# Patient Record
Sex: Female | Born: 1940
Health system: Southern US, Community
[De-identification: ages and names within clinical notes are randomized; demographics above are authoritative.]

---

## 1997-11-10 ENCOUNTER — Ambulatory Visit (HOSPITAL_COMMUNITY): Admission: RE | Admit: 1997-11-10 | Discharge: 1997-11-10 | Payer: Self-pay | Admitting: Family Medicine

## 1998-10-05 ENCOUNTER — Other Ambulatory Visit: Admission: RE | Admit: 1998-10-05 | Discharge: 1998-10-05 | Payer: Self-pay | Admitting: Family Medicine

## 1999-05-28 ENCOUNTER — Encounter: Payer: Self-pay | Admitting: Family Medicine

## 1999-05-28 ENCOUNTER — Encounter: Admission: RE | Admit: 1999-05-28 | Discharge: 1999-05-28 | Payer: Self-pay | Admitting: Family Medicine

## 1999-10-05 ENCOUNTER — Other Ambulatory Visit: Admission: RE | Admit: 1999-10-05 | Discharge: 1999-10-05 | Payer: Self-pay | Admitting: Family Medicine

## 1999-10-06 ENCOUNTER — Encounter: Payer: Self-pay | Admitting: Family Medicine

## 1999-10-06 ENCOUNTER — Encounter: Admission: RE | Admit: 1999-10-06 | Discharge: 1999-10-06 | Payer: Self-pay | Admitting: Family Medicine

## 2000-05-15 ENCOUNTER — Encounter: Admission: RE | Admit: 2000-05-15 | Discharge: 2000-05-15 | Payer: Self-pay | Admitting: Family Medicine

## 2000-05-15 ENCOUNTER — Encounter: Payer: Self-pay | Admitting: Family Medicine

## 2000-10-17 ENCOUNTER — Other Ambulatory Visit: Admission: RE | Admit: 2000-10-17 | Discharge: 2000-10-17 | Payer: Self-pay | Admitting: Family Medicine

## 2001-05-17 ENCOUNTER — Encounter: Admission: RE | Admit: 2001-05-17 | Discharge: 2001-05-17 | Payer: Self-pay | Admitting: Family Medicine

## 2001-05-17 ENCOUNTER — Encounter: Payer: Self-pay | Admitting: Family Medicine

## 2001-10-24 ENCOUNTER — Other Ambulatory Visit: Admission: RE | Admit: 2001-10-24 | Discharge: 2001-10-24 | Payer: Self-pay | Admitting: Family Medicine

## 2002-05-08 ENCOUNTER — Encounter: Payer: Self-pay | Admitting: Family Medicine

## 2002-05-08 ENCOUNTER — Encounter: Admission: RE | Admit: 2002-05-08 | Discharge: 2002-05-08 | Payer: Self-pay | Admitting: Family Medicine

## 2002-10-18 ENCOUNTER — Other Ambulatory Visit: Admission: RE | Admit: 2002-10-18 | Discharge: 2002-10-18 | Payer: Self-pay | Admitting: Family Medicine

## 2003-05-12 ENCOUNTER — Encounter: Admission: RE | Admit: 2003-05-12 | Discharge: 2003-05-12 | Payer: Self-pay | Admitting: Family Medicine

## 2003-11-18 ENCOUNTER — Other Ambulatory Visit: Admission: RE | Admit: 2003-11-18 | Discharge: 2003-11-18 | Payer: Self-pay | Admitting: Family Medicine

## 2003-11-21 ENCOUNTER — Encounter: Admission: RE | Admit: 2003-11-21 | Discharge: 2003-11-21 | Payer: Self-pay | Admitting: Family Medicine

## 2004-05-04 ENCOUNTER — Encounter: Admission: RE | Admit: 2004-05-04 | Discharge: 2004-05-04 | Payer: Self-pay | Admitting: Family Medicine

## 2004-11-19 ENCOUNTER — Ambulatory Visit: Payer: Self-pay | Admitting: Family Medicine

## 2004-11-26 ENCOUNTER — Ambulatory Visit: Payer: Self-pay | Admitting: Family Medicine

## 2004-11-26 ENCOUNTER — Other Ambulatory Visit: Admission: RE | Admit: 2004-11-26 | Discharge: 2004-11-26 | Payer: Self-pay | Admitting: Family Medicine

## 2004-12-09 ENCOUNTER — Ambulatory Visit: Payer: Self-pay | Admitting: Family Medicine

## 2005-05-15 ENCOUNTER — Emergency Department (HOSPITAL_COMMUNITY): Admission: EM | Admit: 2005-05-15 | Discharge: 2005-05-15 | Payer: Self-pay | Admitting: Orthopedic Surgery

## 2005-06-01 ENCOUNTER — Encounter: Admission: RE | Admit: 2005-06-01 | Discharge: 2005-06-01 | Payer: Self-pay | Admitting: Family Medicine

## 2005-11-25 ENCOUNTER — Ambulatory Visit: Payer: Self-pay | Admitting: Family Medicine

## 2005-12-07 ENCOUNTER — Other Ambulatory Visit: Admission: RE | Admit: 2005-12-07 | Discharge: 2005-12-07 | Payer: Self-pay | Admitting: Family Medicine

## 2005-12-07 ENCOUNTER — Ambulatory Visit: Payer: Self-pay | Admitting: Family Medicine

## 2005-12-07 ENCOUNTER — Encounter (INDEPENDENT_AMBULATORY_CARE_PROVIDER_SITE_OTHER): Payer: Self-pay | Admitting: *Deleted

## 2006-06-02 ENCOUNTER — Encounter: Admission: RE | Admit: 2006-06-02 | Discharge: 2006-06-02 | Payer: Self-pay | Admitting: Family Medicine

## 2006-06-26 IMAGING — CR DG CHEST 2V
2 series · 2 of 2 positions shown · non-contrast
Comparison: None.

CLINICAL DATA: 64-year-old, with chest pain.  Evaluate for myocardial infarction.  
 2-VIEW CHEST:

[w chest pa]
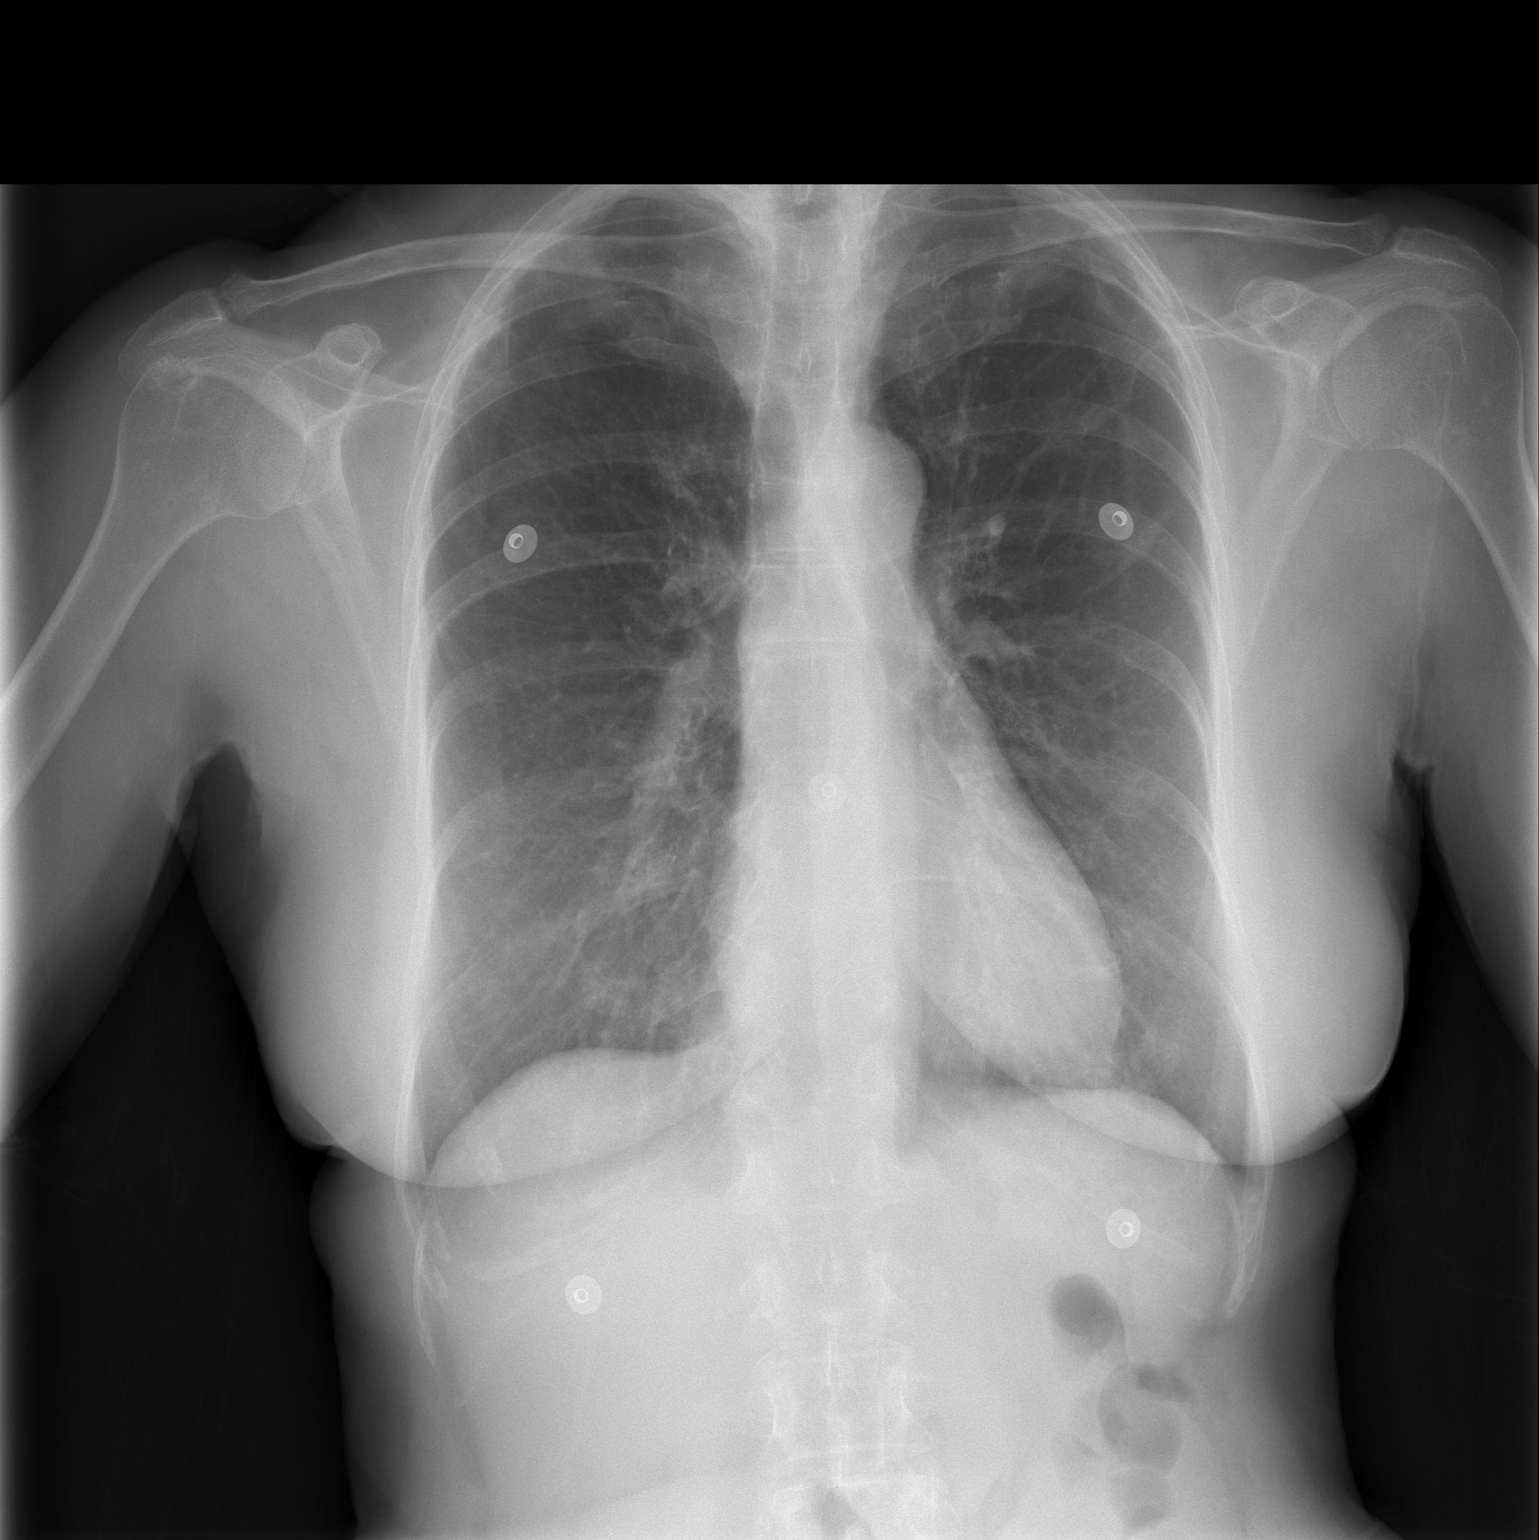

[w chest lat]
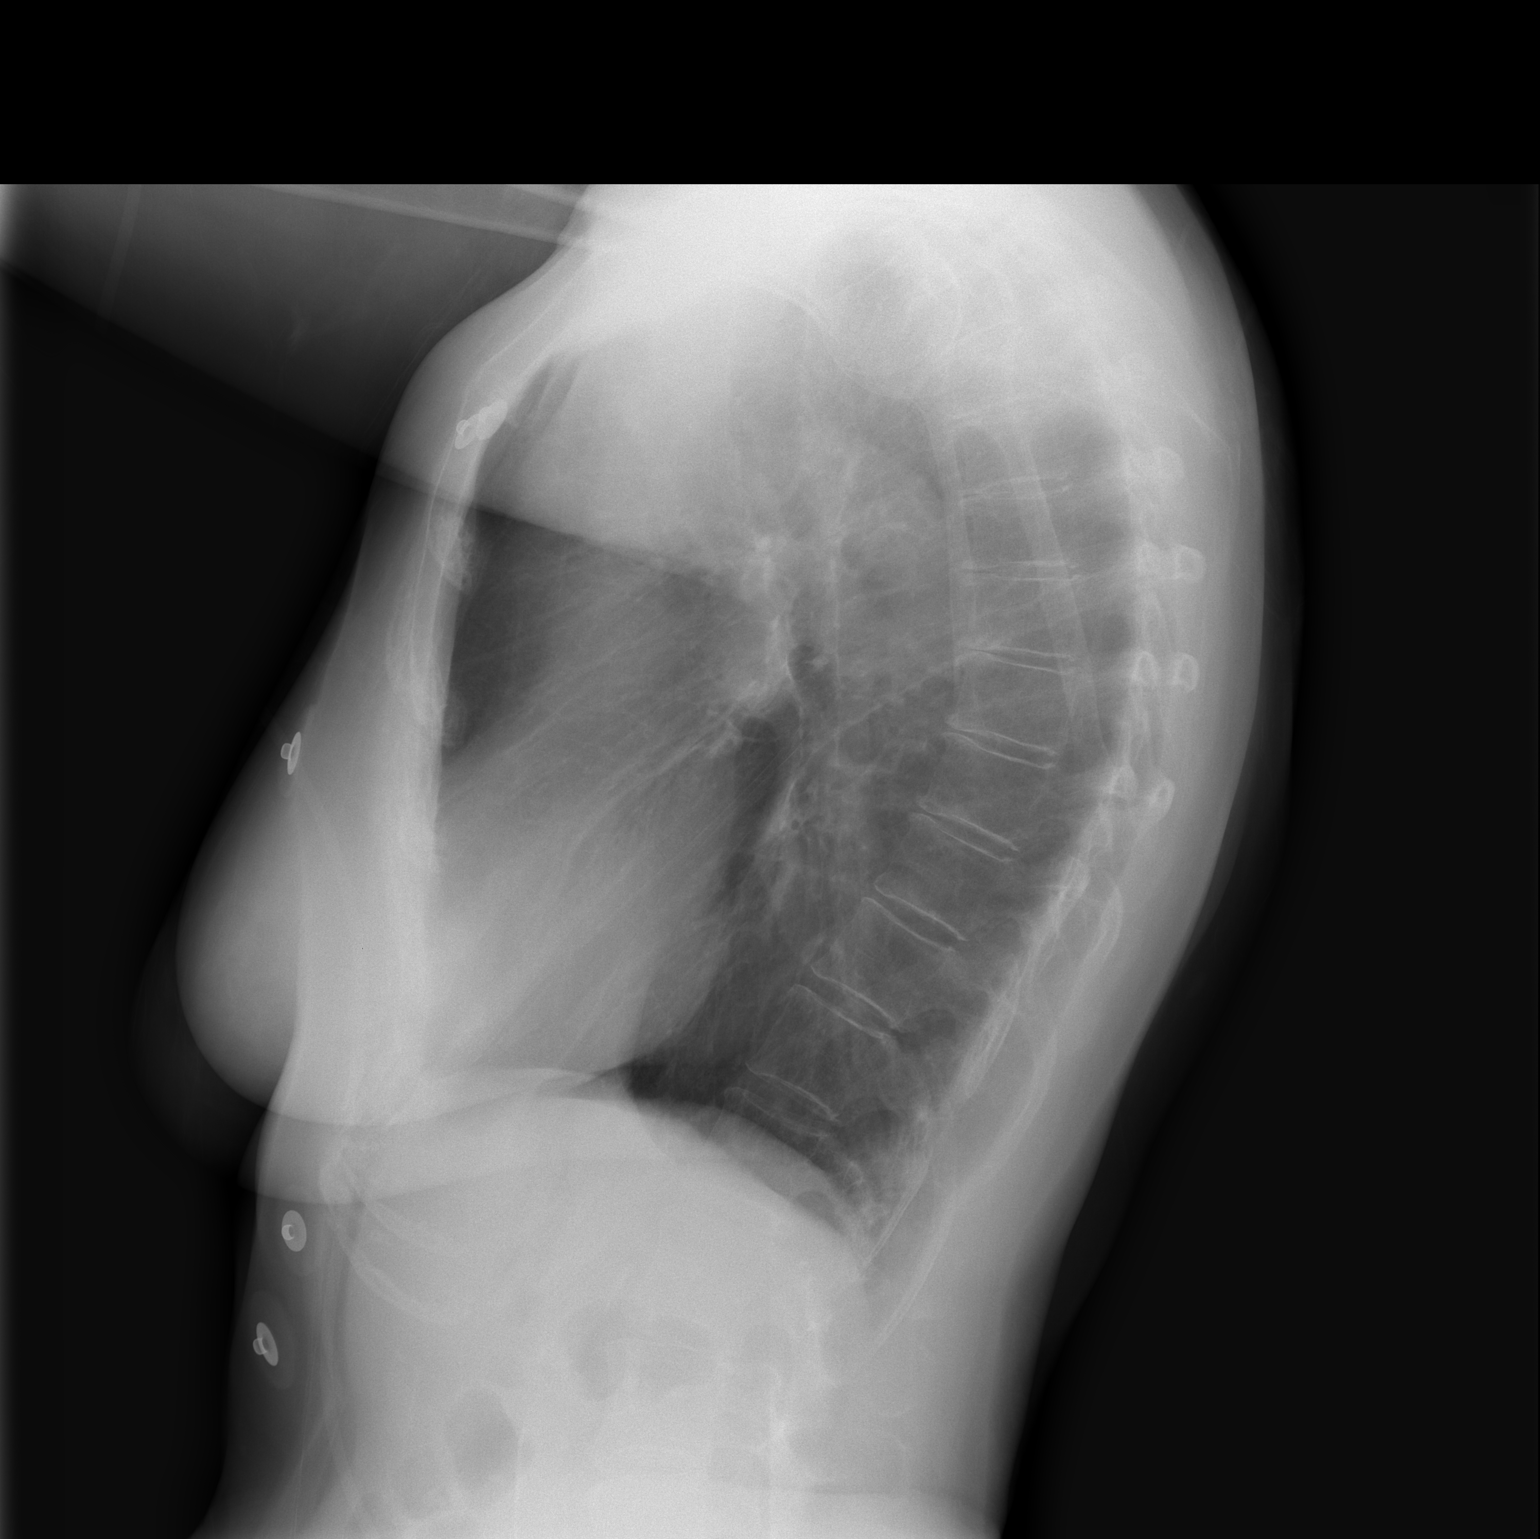

[2 of 2 positions shown; findings below may reference images not displayed]

FINDINGS: The lungs are mildly hyperinflated.  Perihilar bronchitic change is identified.  No focal consolidation or pleural effusion.  Heart is normal in size and there is no evidence for pulmonary edema.
IMPRESSION: 1.  No evidence for acute abnormality.
 2.  Bronchitic changes.

## 2006-06-29 ENCOUNTER — Ambulatory Visit: Payer: Self-pay | Admitting: Family Medicine

## 2006-11-22 ENCOUNTER — Ambulatory Visit: Payer: Self-pay | Admitting: Family Medicine

## 2006-11-22 LAB — CONVERTED CEMR LAB
Basophils Relative: 1 % (ref 0.0–1.0)
Bilirubin, Direct: 0.1 mg/dL (ref 0.0–0.3)
Cholesterol: 231 mg/dL (ref 0–200)
Direct LDL: 139.6 mg/dL
Eosinophils Absolute: 0.1 10*3/uL (ref 0.0–0.6)
GFR calc Af Amer: 80 mL/min
GFR calc non Af Amer: 66 mL/min
Glucose, Bld: 90 mg/dL (ref 70–99)
Hemoglobin: 14.3 g/dL (ref 12.0–15.0)
MCHC: 33.5 g/dL (ref 30.0–36.0)
Monocytes Absolute: 0.4 10*3/uL (ref 0.2–0.7)
Monocytes Relative: 14.3 % — ABNORMAL HIGH (ref 3.0–11.0)
Neutro Abs: 1.2 10*3/uL — ABNORMAL LOW (ref 1.4–7.7)
Neutrophils Relative %: 47.2 % (ref 43.0–77.0)
Potassium: 4.2 meq/L (ref 3.5–5.1)
RBC: 4.64 M/uL (ref 3.87–5.11)
RDW: 11.5 % (ref 11.5–14.6)
Sodium: 145 meq/L (ref 135–145)
Total CHOL/HDL Ratio: 4.4
Triglycerides: 80 mg/dL (ref 0–149)
VLDL: 16 mg/dL (ref 0–40)

## 2006-12-08 ENCOUNTER — Other Ambulatory Visit: Admission: RE | Admit: 2006-12-08 | Discharge: 2006-12-08 | Payer: Self-pay | Admitting: Family Medicine

## 2006-12-08 ENCOUNTER — Ambulatory Visit: Payer: Self-pay | Admitting: Family Medicine

## 2006-12-08 ENCOUNTER — Encounter: Payer: Self-pay | Admitting: Family Medicine

## 2007-02-05 ENCOUNTER — Ambulatory Visit: Payer: Self-pay | Admitting: Internal Medicine

## 2007-02-20 ENCOUNTER — Encounter: Payer: Self-pay | Admitting: Family Medicine

## 2007-02-20 ENCOUNTER — Ambulatory Visit: Payer: Self-pay | Admitting: Internal Medicine

## 2007-03-13 ENCOUNTER — Ambulatory Visit: Payer: Self-pay | Admitting: Family Medicine

## 2007-03-13 DIAGNOSIS — IMO0002 Reserved for concepts with insufficient information to code with codable children: Secondary | ICD-10-CM

## 2007-06-05 ENCOUNTER — Encounter: Admission: RE | Admit: 2007-06-05 | Discharge: 2007-06-05 | Payer: Self-pay | Admitting: Family Medicine

## 2007-08-06 ENCOUNTER — Ambulatory Visit: Payer: Self-pay | Admitting: Family Medicine

## 2007-08-06 DIAGNOSIS — J029 Acute pharyngitis, unspecified: Secondary | ICD-10-CM

## 2007-08-06 LAB — CONVERTED CEMR LAB: Rapid Strep: NEGATIVE

## 2007-08-08 ENCOUNTER — Emergency Department (HOSPITAL_COMMUNITY): Admission: EM | Admit: 2007-08-08 | Discharge: 2007-08-08 | Payer: Self-pay | Admitting: Emergency Medicine

## 2007-12-03 ENCOUNTER — Ambulatory Visit: Payer: Self-pay | Admitting: Family Medicine

## 2007-12-03 LAB — CONVERTED CEMR LAB
ALT: 13 units/L (ref 0–35)
AST: 17 units/L (ref 0–37)
Albumin: 3.8 g/dL (ref 3.5–5.2)
BUN: 16 mg/dL (ref 6–23)
Basophils Absolute: 0 10*3/uL (ref 0.0–0.1)
Basophils Relative: 0.6 % (ref 0.0–1.0)
CO2: 30 meq/L (ref 19–32)
Calcium: 9.4 mg/dL (ref 8.4–10.5)
Chloride: 105 meq/L (ref 96–112)
Creatinine, Ser: 0.8 mg/dL (ref 0.4–1.2)
Direct LDL: 126.3 mg/dL
Eosinophils Relative: 6.1 % — ABNORMAL HIGH (ref 0.0–5.0)
Glucose, Bld: 93 mg/dL (ref 70–99)
Glucose, Urine, Semiquant: NEGATIVE
Hemoglobin: 13.5 g/dL (ref 12.0–15.0)
Lymphocytes Relative: 24.6 % (ref 12.0–46.0)
MCHC: 34.8 g/dL (ref 30.0–36.0)
MCV: 93.8 fL (ref 78.0–100.0)
Neutro Abs: 1.7 10*3/uL (ref 1.4–7.7)
Nitrite: NEGATIVE
RBC: 4.13 M/uL (ref 3.87–5.11)
Specific Gravity, Urine: >=1.03
TSH: 0.54 microintl units/mL (ref 0.35–5.50)
Total Bilirubin: 0.6 mg/dL (ref 0.3–1.2)
Total Protein: 6.3 g/dL (ref 6.0–8.3)
VLDL: 15 mg/dL (ref 0–40)
WBC Urine, dipstick: NEGATIVE
WBC: 2.9 10*3/uL — ABNORMAL LOW (ref 4.5–10.5)
pH: 5

## 2007-12-10 ENCOUNTER — Encounter: Payer: Self-pay | Admitting: Family Medicine

## 2007-12-10 ENCOUNTER — Other Ambulatory Visit: Admission: RE | Admit: 2007-12-10 | Discharge: 2007-12-10 | Payer: Self-pay | Admitting: Family Medicine

## 2007-12-10 ENCOUNTER — Ambulatory Visit: Payer: Self-pay | Admitting: Family Medicine

## 2007-12-10 DIAGNOSIS — M810 Age-related osteoporosis without current pathological fracture: Secondary | ICD-10-CM | POA: Insufficient documentation

## 2007-12-10 DIAGNOSIS — L500 Allergic urticaria: Secondary | ICD-10-CM

## 2007-12-12 ENCOUNTER — Ambulatory Visit: Payer: Self-pay | Admitting: Internal Medicine

## 2007-12-12 ENCOUNTER — Encounter: Payer: Self-pay | Admitting: Family Medicine

## 2007-12-27 ENCOUNTER — Telehealth: Payer: Self-pay | Admitting: Family Medicine

## 2008-02-21 ENCOUNTER — Ambulatory Visit (HOSPITAL_COMMUNITY): Admission: RE | Admit: 2008-02-21 | Discharge: 2008-02-24 | Payer: Self-pay | Admitting: Orthopedic Surgery

## 2008-03-03 ENCOUNTER — Telehealth: Payer: Self-pay | Admitting: Family Medicine

## 2008-03-04 ENCOUNTER — Ambulatory Visit: Payer: Self-pay | Admitting: Family Medicine

## 2008-03-04 LAB — CONVERTED CEMR LAB
Alkaline Phosphatase: 88 units/L (ref 39–117)
Basophils Absolute: 0 10*3/uL (ref 0.0–0.1)
Calcium: 9.7 mg/dL (ref 8.4–10.5)
Lymphocytes Relative: 22.3 % (ref 12.0–46.0)
MCHC: 33.4 g/dL (ref 30.0–36.0)
Monocytes Relative: 10.2 % (ref 3.0–12.0)
Neutrophils Relative %: 63.4 % (ref 43.0–77.0)
Platelets: 333 10*3/uL (ref 150–400)
RDW: 12.1 % (ref 11.5–14.6)
Vit D, 1,25-Dihydroxy: 31 (ref 30–89)

## 2008-03-05 ENCOUNTER — Encounter: Payer: Self-pay | Admitting: Family Medicine

## 2008-03-05 LAB — CONVERTED CEMR LAB: PTH: 21.4 pg/mL (ref 14.0–72.0)

## 2008-03-11 ENCOUNTER — Emergency Department (HOSPITAL_COMMUNITY): Admission: EM | Admit: 2008-03-11 | Discharge: 2008-03-11 | Payer: Self-pay | Admitting: Emergency Medicine

## 2008-03-11 DIAGNOSIS — R55 Syncope and collapse: Secondary | ICD-10-CM | POA: Insufficient documentation

## 2008-03-13 ENCOUNTER — Ambulatory Visit: Payer: Self-pay | Admitting: Family Medicine

## 2008-03-13 DIAGNOSIS — F329 Major depressive disorder, single episode, unspecified: Secondary | ICD-10-CM

## 2008-03-20 ENCOUNTER — Ambulatory Visit: Payer: Self-pay | Admitting: Endocrinology

## 2008-03-20 DIAGNOSIS — Z87448 Personal history of other diseases of urinary system: Secondary | ICD-10-CM | POA: Insufficient documentation

## 2008-03-20 DIAGNOSIS — Z9189 Other specified personal risk factors, not elsewhere classified: Secondary | ICD-10-CM | POA: Insufficient documentation

## 2008-06-05 ENCOUNTER — Encounter: Admission: RE | Admit: 2008-06-05 | Discharge: 2008-06-05 | Payer: Self-pay | Admitting: Family Medicine

## 2008-06-17 ENCOUNTER — Ambulatory Visit: Payer: Self-pay | Admitting: Family Medicine

## 2008-06-17 DIAGNOSIS — M26609 Unspecified temporomandibular joint disorder, unspecified side: Secondary | ICD-10-CM | POA: Insufficient documentation

## 2008-06-17 DIAGNOSIS — M79609 Pain in unspecified limb: Secondary | ICD-10-CM

## 2008-12-05 ENCOUNTER — Ambulatory Visit: Payer: Self-pay | Admitting: Family Medicine

## 2008-12-05 LAB — CONVERTED CEMR LAB
ALT: 12 units/L (ref 0–35)
AST: 24 units/L (ref 0–37)
Alkaline Phosphatase: 56 units/L (ref 39–117)
BUN: 21 mg/dL (ref 6–23)
Basophils Relative: 0.6 % (ref 0.0–3.0)
Bilirubin Urine: NEGATIVE
Bilirubin, Direct: 0.1 mg/dL (ref 0.0–0.3)
Chloride: 107 meq/L (ref 96–112)
Cholesterol: 196 mg/dL (ref 0–200)
Creatinine, Ser: 0.9 mg/dL (ref 0.4–1.2)
Eosinophils Relative: 6.1 % — ABNORMAL HIGH (ref 0.0–5.0)
GFR calc non Af Amer: 65.96 mL/min (ref >60)
LDL Cholesterol: 124 mg/dL — ABNORMAL HIGH (ref 0–99)
Monocytes Relative: 12.6 % — ABNORMAL HIGH (ref 3.0–12.0)
Neutrophils Relative %: 45.2 % (ref 43.0–77.0)
Nitrite: NEGATIVE
Platelets: 171 10*3/uL (ref 150.0–400.0)
Potassium: 5.5 meq/L — ABNORMAL HIGH (ref 3.5–5.1)
Protein, U semiquant: NEGATIVE
RBC: 4.26 M/uL (ref 3.87–5.11)
Total Bilirubin: 1 mg/dL (ref 0.3–1.2)
Total CHOL/HDL Ratio: 3
Total Protein: 6.5 g/dL (ref 6.0–8.3)
Triglycerides: 68 mg/dL (ref 0.0–149.0)
Urobilinogen, UA: 0.2
VLDL: 13.6 mg/dL (ref 0.0–40.0)
WBC: 2.4 10*3/uL — ABNORMAL LOW (ref 4.5–10.5)

## 2008-12-16 ENCOUNTER — Other Ambulatory Visit: Admission: RE | Admit: 2008-12-16 | Discharge: 2008-12-16 | Payer: Self-pay | Admitting: Family Medicine

## 2008-12-16 ENCOUNTER — Ambulatory Visit: Payer: Self-pay | Admitting: Family Medicine

## 2008-12-16 ENCOUNTER — Encounter: Payer: Self-pay | Admitting: Family Medicine

## 2009-06-08 ENCOUNTER — Encounter: Admission: RE | Admit: 2009-06-08 | Discharge: 2009-06-08 | Payer: Self-pay | Admitting: Family Medicine

## 2009-11-13 ENCOUNTER — Ambulatory Visit: Payer: Self-pay | Admitting: Family Medicine

## 2009-11-13 LAB — CONVERTED CEMR LAB
ALT: 15 units/L (ref 0–35)
AST: 21 units/L (ref 0–37)
BUN: 22 mg/dL (ref 6–23)
Bilirubin, Direct: 0.2 mg/dL (ref 0.0–0.3)
Eosinophils Relative: 5 % (ref 0.0–5.0)
GFR calc non Af Amer: 68.41 mL/min (ref >60)
Glucose, Urine, Semiquant: NEGATIVE
HCT: 40.5 % (ref 36.0–46.0)
HDL: 66.8 mg/dL (ref >39.00)
Lymphs Abs: 0.9 10*3/uL (ref 0.7–4.0)
Monocytes Relative: 11.4 % (ref 3.0–12.0)
Neutrophils Relative %: 52.1 % (ref 43.0–77.0)
Nitrite: NEGATIVE
Platelets: 200 10*3/uL (ref 150.0–400.0)
Potassium: 5.2 meq/L — ABNORMAL HIGH (ref 3.5–5.1)
Protein, U semiquant: NEGATIVE
RBC: 4.28 M/uL (ref 3.87–5.11)
Total Bilirubin: 0.9 mg/dL (ref 0.3–1.2)
Total CHOL/HDL Ratio: 3
Urobilinogen, UA: 0.2
VLDL: 13.4 mg/dL (ref 0.0–40.0)
WBC Urine, dipstick: NEGATIVE
WBC: 3 10*3/uL — ABNORMAL LOW (ref 4.5–10.5)
pH: 5

## 2009-11-20 ENCOUNTER — Other Ambulatory Visit: Admission: RE | Admit: 2009-11-20 | Discharge: 2009-11-20 | Payer: Self-pay | Admitting: Family Medicine

## 2009-11-20 ENCOUNTER — Ambulatory Visit: Payer: Self-pay | Admitting: Family Medicine

## 2009-11-23 ENCOUNTER — Encounter: Payer: Self-pay | Admitting: Family Medicine

## 2009-12-02 ENCOUNTER — Telehealth: Payer: Self-pay

## 2009-12-21 ENCOUNTER — Ambulatory Visit: Payer: Self-pay | Admitting: Internal Medicine

## 2009-12-21 ENCOUNTER — Encounter: Payer: Self-pay | Admitting: Family Medicine

## 2010-01-11 ENCOUNTER — Telehealth: Payer: Self-pay | Admitting: Family Medicine

## 2010-05-14 ENCOUNTER — Ambulatory Visit: Payer: Self-pay | Admitting: Family Medicine

## 2010-05-14 DIAGNOSIS — F4320 Adjustment disorder, unspecified: Secondary | ICD-10-CM | POA: Insufficient documentation

## 2010-06-19 ENCOUNTER — Other Ambulatory Visit: Payer: Self-pay | Admitting: Family Medicine

## 2010-06-19 DIAGNOSIS — Z1239 Encounter for other screening for malignant neoplasm of breast: Secondary | ICD-10-CM

## 2010-06-29 NOTE — Assessment & Plan Note (Signed)
Summary: cpx/njr   Vital Signs:  Patient profile:   70 year old female Menstrual status:  postmenopausal Height:      62 inches Weight:      132 pounds BMI:     24.23 Temp:     98.2 degrees F oral BP sitting:   120 / 78  (left arm) Cuff size:   regular  Vitals Entered By: Kern Reap CMA Duncan Dull) (November 20, 2009 10:56 AM) CC: cpx   Primary Care Provider:  Roderick Pee MD  CC:  cpx.  History of Present Illness: Wendy Holden is a 70 year old, married female, nonsmoker, who comes in today for general physical examination  She's always been in excellent, health.  She's had no chronic health problems.  She takes one aspirin tablet daily, calcium and vitamin D.  She was diagnosed with osteoporosis many years ago.  She was on Fosamax and then had a long bone fracture.  She's been off the Fosamax now for 3 years.  She did shooting eye care.  Dental care does BSE monthly.  Gets any mammography.  Colonoscopy normal 2007, shingles 2009, Pneumovax 2006........... states she will not have another pneumonia vaccine........ also declined, seasonal flu shot  Allergies: No Known Drug Allergies  Past History:  Past medical, surgical, family and social histories (including risk factors) reviewed, and no changes noted (except as noted below).  Past Medical History: Reviewed history from 03/13/2008 and no changes required. Osteoporosis urticaria childbirth x 2 fractured tibia-fibula, right by Dr. Terrilee Croak Depression syncopal episode  Past Surgical History: Reviewed history from 03/20/2008 and no changes required. Appendectomy (1969) Tubal ligation (0454) Tonsillectomy (0981) Breast Biopsy (1984 & 1994)  Family History: Reviewed history from 06/17/2008 and no changes required. grandmother had kyphosis and hip fx at advanced age mother fell and broke her wrist died at 9.  Postop valve replacement  Social History: Reviewed history from 12/10/2007 and no changes  required. Occupation: Married Never Smoked Alcohol use-no Drug use-no Regular exercise-yes Retired  Review of Systems      See HPI  Physical Exam  General:  Well-developed,well-nourished,in no acute distress; alert,appropriate and cooperative throughout examination Head:  Normocephalic and atraumatic without obvious abnormalities. No apparent alopecia or balding. Eyes:  No corneal or conjunctival inflammation noted. EOMI. Perrla. Funduscopic exam benign, without hemorrhages, exudates or papilledema. Vision grossly normal. Ears:  External ear exam shows no significant lesions or deformities.  Otoscopic examination reveals clear canals, tympanic membranes are intact bilaterally without bulging, retraction, inflammation or discharge. Hearing is grossly normal bilaterally. Nose:  External nasal examination shows no deformity or inflammation. Nasal mucosa are pink and moist without lesions or exudates. Mouth:  Oral mucosa and oropharynx without lesions or exudates.  Teeth in good repair. Neck:  No deformities, masses, or tenderness noted. Chest Wall:  No deformities, masses, or tenderness noted. Breasts:  No mass, nodules, thickening, tenderness, bulging, retraction, inflamation, nipple discharge or skin changes noted.   Lungs:  Normal respiratory effort, chest expands symmetrically. Lungs are clear to auscultation, no crackles or wheezes. Heart:  Normal rate and regular rhythm. S1 and S2 normal without gallop, murmur, click, rub or other extra sounds. Abdomen:  Bowel sounds positive,abdomen soft and non-tender without masses, organomegaly or hernias noted. Rectal:  No external abnormalities noted. Normal sphincter tone. No rectal masses or tenderness. Genitalia:  Pelvic Exam:        External: normal female genitalia without lesions or masses        Vagina: normal without lesions  or masses        Cervix: normal without lesions or masses        Adnexa: normal bimanual exam without masses or  fullness        Uterus: normal by palpation        Pap smear: performed Msk:  No deformity or scoliosis noted of thoracic or lumbar spine.   Pulses:  R and L carotid,radial,femoral,dorsalis pedis and posterior tibial pulses are full and equal bilaterally Extremities:  No clubbing, cyanosis, edema, or deformity noted with normal full range of motion of all joints.   Neurologic:  No cranial nerve deficits noted. Station and gait are normal. Plantar reflexes are down-going bilaterally. DTRs are symmetrical throughout. Sensory, motor and coordinative functions appear intact. Skin:  Intact without suspicious lesions or rashes Cervical Nodes:  No lymphadenopathy noted Axillary Nodes:  No palpable lymphadenopathy Inguinal Nodes:  No significant adenopathy Psych:  Cognition and judgment appear intact. Alert and cooperative with normal attention span and concentration. No apparent delusions, illusions, hallucinations   Impression & Recommendations:  Problem # 1:  HEALTH MAINTENANCE EXAM (ICD-V70.0) Assessment Unchanged  Orders: Venipuncture (16109) TLB-Lipid Panel (80061-LIPID) TLB-BMP (Basic Metabolic Panel-BMET) (80048-METABOL) TLB-CBC Platelet - w/Differential (85025-CBCD) TLB-Hepatic/Liver Function Pnl (80076-HEPATIC) TLB-TSH (Thyroid Stimulating Hormone) (84443-TSH) UA Dipstick w/o Micro (automated)  (81003)  Problem # 2:  OSTEOPOROSIS (ICD-733.00) Assessment: Unchanged  Orders: T-Bone Densitometry (60454) Venipuncture (09811) TLB-Lipid Panel (80061-LIPID) TLB-BMP (Basic Metabolic Panel-BMET) (80048-METABOL) TLB-CBC Platelet - w/Differential (85025-CBCD) TLB-Hepatic/Liver Function Pnl (80076-HEPATIC) TLB-TSH (Thyroid Stimulating Hormone) (84443-TSH) UA Dipstick w/o Micro (automated)  (81003)  Complete Medication List: 1)  Aspirin 81 Mg Tabs (Aspirin) .... Take 1 tablet by mouth once a day 2)  Calcium 600/vitamin D 600-400 Mg-unit Tabs (Calcium carbonate-vitamin d) .... Three  times a day  Patient Instructions: 1)  we will call you the report of your bone density.  If you do not hear from [place] after 4 weeks, then call us 2)  Please schedule a follow-up appointment in 1 year.    Appended Document: cpx/njr     Allergies: No Known Drug Allergies   Complete Medication List: 1)  Aspirin 81 Mg Tabs (Aspirin) .... Take 1 tablet by mouth once a day 2)  Calcium 600/vitamin D 600-400 Mg-unit Tabs (Calcium carbonate-vitamin d) .... Three times a day  Other Orders: EKG w/ Interpretation (93000)

## 2010-06-29 NOTE — Miscellaneous (Signed)
Summary: BONE DENSITY  Clinical Lists Changes  Orders: Added new Test order of T-Bone Densitometry [phone]) - Signed

## 2010-06-29 NOTE — Miscellaneous (Signed)
Summary: BONE DENSITY  Clinical Lists Changes  Orders: Added new Test order of T-Lumbar Vertebral Assessment (77082) - Signed 

## 2010-06-29 NOTE — Progress Notes (Signed)
Summary: need to r/s bone density test  Phone Note Call from Patient   Caller: Patient Call For: Roderick Pee MD Reason for Call: Talk to Nurse Details for Reason: r/s bone density test Summary of Call: needed to r/s bone density - was to early per ins for it to be covered - gave [facility] xray # [phone] to r/s 1 week from now. Instructed pt to call if problems or they said we needed to r/s for her. KIK Initial call taken by: Duard Brady LPN,  December 02, 1189 12:10 PM

## 2010-06-29 NOTE — Progress Notes (Signed)
Summary: bmd  Phone Note Call from Patient Call back at [phone] cell   Caller: Patient Call For: Roderick Pee MD Summary of Call: pt needs bmd results please call cell Initial call taken by: Heron Sabins,  January 11, 2010 11:06 AM  Follow-up for Phone Call        patient would like copy of bmd - sent to home address Follow-up by: Kern Reap CMA Duncan Dull),  January 11, 2010 12:18 PM

## 2010-07-01 NOTE — Assessment & Plan Note (Signed)
Summary: anxiety/njr   Vital Signs:  Patient profile:   70 year old female Menstrual status:  postmenopausal Weight:      131 pounds Temp:     98.2 degrees F BP sitting:   130 / 80  (left arm) Cuff size:   regular  Vitals Entered By: Kern Reap CMA Duncan Dull) (May 14, 2010 3:41 PM) CC: anxiety   Primary Care Lamaria Hildebrandt:  Roderick Pee MD  CC:  anxiety.  History of Present Illness: Wendy Holden is a 70 year old, married female, nonsmoker, [place] in today with symptoms of general anxiety disorder manifested by feeling anxious intermittently through the day.  Sleep dysfunction.  She had an episode like this about two years ago and took a short dose of Klonopin and recovered nicely.  Allergies: No Known Drug Allergies  Past History:  Past medical, surgical, family and social histories (including risk factors) reviewed for relevance to current acute and chronic problems.  Past Medical History: Reviewed history from 03/13/2008 and no changes required. Osteoporosis urticaria childbirth x 2 fractured tibia-fibula, right by Dr. Terrilee Croak Depression syncopal episode  Past Surgical History: Reviewed history from 03/20/2008 and no changes required. Appendectomy (1969) Tubal ligation (1610) Tonsillectomy (9604) Breast Biopsy (1984 & 1994)  Family History: Reviewed history from 06/17/2008 and no changes required. grandmother had kyphosis and hip fx at advanced age mother fell and broke her wrist died at 70.  Postop valve replacement  Social History: Reviewed history from 12/10/2007 and no changes required. Occupation: Married Never Smoked Alcohol use-no Drug use-no Regular exercise-yes Retired  Review of Systems      See HPI  Physical Exam  General:  Well-developed,well-nourished,in no acute distress; alert,appropriate and cooperative throughout examination Psych:  Cognition and judgment appear intact. Alert and cooperative  with normal attention span and concentration. No apparent delusions, illusions, hallucinations   Problems:  Medical Problems Added: 1)  Dx of Adjustment Reaction, Nos  (ICD-309.9)  Impression & Recommendations:  Problem # 1:  ADJUSTMENT REACTION, NOS (ICD-309.9) Assessment New  Complete Medication List: 1)  Aspirin 81 Mg Tabs (Aspirin) .... Take 1 tablet by mouth once a day 2)  Calcium 600/vitamin D 600-400 Mg-unit Tabs (Calcium carbonate-vitamin d) .... Three times a day 3)  Klonopin 1 Mg Tabs (Clonazepam) .Marland Kitchen.. 1 tab @ bedtime  Patient Instructions: 1)  take Klonopin 1 mg at bedtime.  Return p.r.n. Prescriptions: KLONOPIN 1 MG TABS (CLONAZEPAM) 1 tab @ bedtime  #30 x 5   Entered and Authorized by:   Roderick Pee MD   Signed by:   Roderick Pee MD on 05/14/2010   Method used:   Print then Give to Patient   RxID:   [id number]    Orders Added: 1)  Est. Patient Level III [21308]

## 2010-07-26 ENCOUNTER — Ambulatory Visit
Admission: RE | Admit: 2010-07-26 | Discharge: 2010-07-26 | Disposition: A | Discharge status: Home / Self Care | Payer: Medicare HMO | Source: Ambulatory Visit | Attending: Family Medicine | Admitting: Family Medicine

## 2010-07-26 DIAGNOSIS — Z1239 Encounter for other screening for malignant neoplasm of breast: Secondary | ICD-10-CM

## 2010-07-27 ENCOUNTER — Other Ambulatory Visit: Payer: Self-pay | Admitting: Family Medicine

## 2010-07-27 ENCOUNTER — Encounter: Payer: Self-pay | Admitting: Family Medicine

## 2010-07-27 ENCOUNTER — Ambulatory Visit (INDEPENDENT_AMBULATORY_CARE_PROVIDER_SITE_OTHER): Payer: Medicare HMO | Admitting: Family Medicine

## 2010-07-27 VITALS — BP 120/80 | Temp 98.6°F | Ht 63.0 in | Wt 131.0 lb

## 2010-07-27 DIAGNOSIS — D229 Melanocytic nevi, unspecified: Secondary | ICD-10-CM

## 2010-07-27 DIAGNOSIS — L570 Actinic keratosis: Secondary | ICD-10-CM

## 2010-07-27 DIAGNOSIS — D239 Other benign neoplasm of skin, unspecified: Secondary | ICD-10-CM

## 2010-07-27 NOTE — Progress Notes (Signed)
  Subjective:    Patient ID: Wendy Holden, female    DOB: 11-04-1940, 70 y.o.   MRN: [id number]  HPI Wendy Holden Is a 69-year-old female, who comes in today for removal of a lesion in its greatest irritated, mid forehead   Review of Systems     Objective:   Physical Exam    Well-developed well-nourished, female, in no acute distress.  She was taken to the treatment room after informed consent, the area was anesthetized with 1% Xylocaine with epinephrine.  The lesion was excised with 2-mm margins and sent for pathologic analysis.  The base was cauterized bandage was applied    Assessment & Plan:

## 2010-07-27 NOTE — Patient Instructions (Signed)
If in two weeks.  I do not call you you call me

## 2010-07-28 ENCOUNTER — Telehealth: Payer: Self-pay | Admitting: Family Medicine

## 2010-07-28 NOTE — Telephone Encounter (Signed)
Please call B. Lesion removed was an actinic keratosis benign

## 2010-07-28 NOTE — Telephone Encounter (Signed)
patient  Is aware 

## 2010-07-28 NOTE — Telephone Encounter (Signed)
patient aware

## 2010-08-12 ENCOUNTER — Telehealth: Payer: Self-pay | Admitting: *Deleted

## 2010-08-12 NOTE — Telephone Encounter (Signed)
Pt states insurance denied claim for lesion removal done on 07/27/10 stating that is was not medically necessary.  She would like a note written on her behalf that states this was actually medically necessary and that pcp recommended she have it done.

## 2010-08-12 NOTE — Telephone Encounter (Signed)
Fleet Contras........ Please give this to [place]

## 2010-08-17 ENCOUNTER — Encounter: Payer: Self-pay | Admitting: *Deleted

## 2010-10-12 ENCOUNTER — Other Ambulatory Visit (INDEPENDENT_AMBULATORY_CARE_PROVIDER_SITE_OTHER): Payer: Medicare HMO

## 2010-10-12 DIAGNOSIS — Z79899 Other long term (current) drug therapy: Secondary | ICD-10-CM

## 2010-10-12 DIAGNOSIS — Z0000 Encounter for general adult medical examination without abnormal findings: Secondary | ICD-10-CM

## 2010-10-12 LAB — BASIC METABOLIC PANEL
Calcium: 9.1 mg/dL (ref 8.4–10.5)
GFR: 77.39 mL/min (ref >60.00)
Glucose, Bld: 87 mg/dL (ref 70–99)
Potassium: 5 mEq/L (ref 3.5–5.1)
Sodium: 141 mEq/L (ref 135–145)

## 2010-10-12 LAB — POCT URINALYSIS DIPSTICK
Bilirubin, UA: NEGATIVE
Ketones, UA: NEGATIVE
Leukocytes, UA: NEGATIVE
Nitrite, UA: NEGATIVE
Protein, UA: NEGATIVE
pH, UA: 7

## 2010-10-12 LAB — HEPATIC FUNCTION PANEL
ALT: 13 U/L (ref 0–35)
AST: 16 U/L (ref 0–37)
Albumin: 3.6 g/dL (ref 3.5–5.2)
Alkaline Phosphatase: 51 U/L (ref 39–117)

## 2010-10-12 LAB — CBC WITH DIFFERENTIAL/PLATELET
Basophils Relative: 0.4 % (ref 0.0–3.0)
Eosinophils Relative: 3.1 % (ref 0.0–5.0)
HCT: 39 % (ref 36.0–46.0)
Hemoglobin: 13.3 g/dL (ref 12.0–15.0)
Lymphocytes Relative: 26.7 % (ref 12.0–46.0)
Lymphs Abs: 1 10*3/uL (ref 0.7–4.0)
Monocytes Relative: 10.5 % (ref 3.0–12.0)
Neutro Abs: 2.2 10*3/uL (ref 1.4–7.7)
RBC: 4.14 Mil/uL (ref 3.87–5.11)
RDW: 12.8 % (ref 11.5–14.6)
WBC: 3.7 10*3/uL — ABNORMAL LOW (ref 4.5–10.5)

## 2010-10-12 LAB — TSH: TSH: 0.51 u[IU]/mL (ref 0.35–5.50)

## 2010-10-12 LAB — LIPID PANEL
Cholesterol: 186 mg/dL (ref 0–200)
VLDL: 20 mg/dL (ref 0.0–40.0)

## 2010-10-12 NOTE — Op Note (Signed)
NAME:  Wendy Holden, Wendy Holden              ACCOUNT NO.:  [id number]   MEDICAL RECORD NO.:  [id number]          PATIENT TYPE:  OIB   LOCATION:  5017                         FACILITY:  [facility]   PHYSICIAN:  Lenard Galloway. Mortenson, M.D.DATE OF BIRTH:  [birth year]   DATE OF PROCEDURE:  02/21/2008  DATE OF DISCHARGE:                               OPERATIVE REPORT   PREOPERATIVE DIAGNOSIS:  Displaced fracture, right distal tibia.   PREOPERATIVE DIAGNOSIS:  Displaced fracture, right distal tibia.   OPERATION:  Open reduction and internal fixation using a 9 mm x 33 cm  tibial nail fixation proximally and distally.   SURGEON:  Lenard Galloway. Chaney Malling, MD   ASSISTANT:  Oris Drone. Petrarca, PA-C   ANESTHESIA:  General.   PROCEDURE:  The patient was placed on the operating table in supine  position with a pneumatic tourniquet above the right upper thigh.  The  entire right lower extremity was prepped with DuraPrep and draped down  in the usual manner.  There was a fracture blister distally and this was  aspirated under sterile conditions and sent off for cultures.  The leg  was wrapped with an Esmarch.  The tourniquet was elevated.  Incision  made over the anterior aspect of knee adjacent to the patellar tendon.  The skin edges were retracted.  The capsule was opened and the tibial  eminence could be palpated.  A guide pin was passed down in front of the  tibial eminence in the midline to the tibia.  This was all done with  radiographic C-arm control.  Once this was in good position, the  proximal drill was passed over the guide pin and a hole was placed in  proximal tibia.  Once this was accomplished, a guide pin with a slight  curve of the tape was then passed down the tibial canal across the  fracture site in a reduced position and down to just above the tibial  plafond.  Excellent alignment in both views was achieved.  A series of  reamers was then used and we reamed up to a 10-mm ball.  Once this was  accomplished, the reamer was removed and the 9 mm x 33 cm tibial nail  was placed on the external guide.  The nail was then passed down the  tibial canal across the fracture site in reduced position and down to  about 1.5 plus centimeters above the plafond.  A good positioning was  determined with x-ray.  Two screws were placed proximally using the  external guide in a standard manner.  This locked proximally in both AP  and lateral views, excellent fixation of the pin was achieved.  The C-  arm was then brought distally and two screws were placed through the  distal two holes and the distal tibia across the IM nail and excellent  fixation was achieved distally.  Vicryl was used to close the capsule.  Subcutaneous tissue was closed and stainless steel staples  were used to close the skin.  Sterile dressings were applied and the  patient returned to recovery room in excellent condition.  The  fracture  was reduced beautifully.  I was very pleased with final surgical  outcome.  Drains none.  Complications none.      Rodney A. Chaney Malling, M.D.  Electronically Signed     RAM/MEDQ  D:  02/21/2008  T:  02/22/2008  Job:  [id number]

## 2010-11-15 ENCOUNTER — Other Ambulatory Visit (HOSPITAL_COMMUNITY)
Admission: RE | Admit: 2010-11-15 | Discharge: 2010-11-15 | Disposition: A | Discharge status: Home / Self Care | Payer: Medicare HMO | Source: Ambulatory Visit | Attending: Family Medicine | Admitting: Family Medicine

## 2010-11-15 ENCOUNTER — Encounter: Payer: Self-pay | Admitting: Family Medicine

## 2010-11-15 ENCOUNTER — Ambulatory Visit (INDEPENDENT_AMBULATORY_CARE_PROVIDER_SITE_OTHER): Payer: Medicare HMO | Admitting: Family Medicine

## 2010-11-15 VITALS — BP 110/78 | Temp 98.4°F | Ht 63.0 in | Wt 130.0 lb

## 2010-11-15 DIAGNOSIS — F4320 Adjustment disorder, unspecified: Secondary | ICD-10-CM

## 2010-11-15 DIAGNOSIS — Z0000 Encounter for general adult medical examination without abnormal findings: Secondary | ICD-10-CM

## 2010-11-15 DIAGNOSIS — Z01419 Encounter for gynecological examination (general) (routine) without abnormal findings: Secondary | ICD-10-CM | POA: Insufficient documentation

## 2010-11-15 MED ORDER — CLONAZEPAM 1 MG PO TABS: 1.0000 mg | ORAL_TABLET | Freq: Every evening | ORAL | Status: DC | PRN

## 2010-11-15 NOTE — Patient Instructions (Signed)
Continue your current medications.  Remember to check her breasts monthly.  Follow-up in one year or sooner if any problems

## 2010-11-15 NOTE — Progress Notes (Signed)
  Subjective:    Patient ID: Wendy Holden, female    DOB: June 01, 1940, 70 y.o.   MRN: [id number]  HPIarmina Is a delightful, 70 year old, married female, nonsmoker, who comes in today for Medicare wellness exam.  She's always been in excellent health.  She has no chronic health problems.  She takes an 81-mg baby aspirin daily, calcium, vitamin D, and Klonopin 1 mg nightly for sleep dysfunction.  Recently, had an ear, nose, and throat evaluation at [facility] where they have moved.  Endoscopy negative.  They feel like she is having symptoms of reflux.  They have on anti-reflex program.  She gets routine eye care, hearing normal, regular dental care, BSE monthly, and he mammography, colonoscopy, normal, tetanus, 2009, Pneumovax, x 2, information given on shingles.  Vaccination.  Activities of daily living.  Normal.  She walks daily.  Cognitive function, normal.  Home safety reviewed.  No other issues identified.  No guns in the house.  She does have a living will and health care power of attorney.  His biopsy right breast at the 12 o'clock position.  Fortunately, benign    Review of Systems  Constitutional: Negative.   HENT: Negative.   Eyes: Negative.   Respiratory: Negative.   Cardiovascular: Negative.   Gastrointestinal: Negative.   Genitourinary: Negative.   Musculoskeletal: Negative.   Neurological: Negative.   Hematological: Negative.   Psychiatric/Behavioral: Negative.        Objective:   Physical Exam  Constitutional: She appears well-developed and well-nourished.  HENT:  Head: Normocephalic and atraumatic.  Right Ear: External ear normal.  Left Ear: External ear normal.  Nose: Nose normal.  Mouth/Throat: Oropharynx is clear and moist.  Eyes: EOM are normal. Pupils are equal, round, and reactive to light.  Neck: Normal range of motion. Neck supple. No thyromegaly present.  Cardiovascular: Normal rate, regular rhythm, normal heart sounds and intact  distal pulses.  Exam reveals no gallop and no friction rub.   No murmur heard. Pulmonary/Chest: Effort normal and breath sounds normal.  Abdominal: Soft. Bowel sounds are normal. She exhibits no distension and no mass. There is no tenderness. There is no rebound.  Genitourinary: Vagina normal and uterus normal. Guaiac negative stool. No vaginal discharge found.       Bilateral breast exam normal except for scar right breast 12 o'clock position.  Musculoskeletal: Normal range of motion.  Lymphadenopathy:    She has no cervical adenopathy.  Neurological: She is alert. She has normal reflexes. No cranial nerve deficit. She exhibits normal muscle tone. Coordination normal.  Skin: Skin is warm and dry.  Psychiatric: She has a normal mood and affect. Her behavior is normal. Judgment and thought content normal.          Assessment & Plan:  Healthy female.  Sleep dysfunction.  Continue Klonopin 1 mg nightly  Status post breast biopsy.  Continue BSE monthly and a new mammography.  History of osteoporosis.  Continue calcium, vitamin D, and walk ing

## 2011-02-28 LAB — CBC
HCT: 31.8 — ABNORMAL LOW
Hemoglobin: 10.9 — ABNORMAL LOW
MCHC: 34
MCHC: 34.3
MCV: 93.3
MCV: 94.7
MCV: 95.4
Platelets: 296
RBC: 3.35 — ABNORMAL LOW
RBC: 3.52 — ABNORMAL LOW
WBC: 5.2
WBC: 8

## 2011-02-28 LAB — COMPREHENSIVE METABOLIC PANEL
AST: 27
Albumin: 3.7
Calcium: 9
Creatinine, Ser: 0.74
GFR calc Af Amer: >60
Total Protein: 6.5

## 2011-02-28 LAB — BASIC METABOLIC PANEL
CO2: 29
Chloride: 102
Chloride: 98
Creatinine, Ser: 0.64
Creatinine, Ser: 0.7
GFR calc Af Amer: >60
GFR calc Af Amer: >60
GFR calc non Af Amer: >60
Potassium: 3.9
Potassium: 4.6

## 2011-02-28 LAB — ANAEROBIC CULTURE

## 2011-02-28 LAB — WOUND CULTURE

## 2011-02-28 LAB — APTT: aPTT: 26

## 2011-02-28 LAB — URINALYSIS, ROUTINE W REFLEX MICROSCOPIC
Nitrite: NEGATIVE
Specific Gravity, Urine: 1.026
pH: 6

## 2011-03-01 LAB — CBC
HCT: 39.7
MCHC: 33.4
MCV: 94.8
Platelets: 182

## 2011-03-01 LAB — DIFFERENTIAL
Basophils Relative: 0
Eosinophils Absolute: 0
Eosinophils Relative: 1
Lymphs Abs: 0.2 — ABNORMAL LOW
Monocytes Relative: 2 — ABNORMAL LOW
Neutrophils Relative %: 95 — ABNORMAL HIGH

## 2011-03-01 LAB — URINALYSIS, ROUTINE W REFLEX MICROSCOPIC
Glucose, UA: NEGATIVE
Hgb urine dipstick: NEGATIVE
Ketones, ur: 15 — AB
Protein, ur: 30 — AB
pH: 7.5

## 2011-03-01 LAB — POCT I-STAT, CHEM 8
Creatinine, Ser: 0.8
Glucose, Bld: 143 — ABNORMAL HIGH
HCT: 42
Hemoglobin: 14.3
Potassium: 4
Sodium: 135
TCO2: 26

## 2011-03-01 LAB — URINE CULTURE: Colony Count: 75000

## 2011-03-01 LAB — URINE MICROSCOPIC-ADD ON

## 2011-03-01 LAB — POCT CARDIAC MARKERS
Troponin i, poc: <0.05
Troponin i, poc: <0.05

## 2011-07-27 ENCOUNTER — Other Ambulatory Visit: Payer: Self-pay | Admitting: Family Medicine

## 2011-07-27 DIAGNOSIS — Z1231 Encounter for screening mammogram for malignant neoplasm of breast: Secondary | ICD-10-CM

## 2011-10-11 ENCOUNTER — Ambulatory Visit
Admission: RE | Admit: 2011-10-11 | Discharge: 2011-10-11 | Disposition: A | Discharge status: Home / Self Care | Payer: Medicare HMO | Source: Ambulatory Visit | Attending: Family Medicine | Admitting: Family Medicine

## 2011-10-11 DIAGNOSIS — Z1231 Encounter for screening mammogram for malignant neoplasm of breast: Secondary | ICD-10-CM

## 2011-11-14 ENCOUNTER — Other Ambulatory Visit (INDEPENDENT_AMBULATORY_CARE_PROVIDER_SITE_OTHER): Payer: Medicare HMO

## 2011-11-14 DIAGNOSIS — Z0000 Encounter for general adult medical examination without abnormal findings: Secondary | ICD-10-CM

## 2011-11-14 DIAGNOSIS — Z79899 Other long term (current) drug therapy: Secondary | ICD-10-CM

## 2011-11-14 LAB — CBC WITH DIFFERENTIAL/PLATELET
Basophils Absolute: 0 10*3/uL (ref 0.0–0.1)
Basophils Relative: 0.9 % (ref 0.0–3.0)
Eosinophils Absolute: 0.1 10*3/uL (ref 0.0–0.7)
HCT: 41.2 % (ref 36.0–46.0)
Hemoglobin: 13.7 g/dL (ref 12.0–15.0)
Lymphocytes Relative: 24.5 % (ref 12.0–46.0)
Lymphs Abs: 0.8 10*3/uL (ref 0.7–4.0)
MCHC: 33.4 g/dL (ref 30.0–36.0)
MCV: 94.7 fl (ref 78.0–100.0)
Monocytes Absolute: 0.4 10*3/uL (ref 0.1–1.0)
Neutro Abs: 1.8 10*3/uL (ref 1.4–7.7)
RBC: 4.35 Mil/uL (ref 3.87–5.11)
RDW: 12.9 % (ref 11.5–14.6)

## 2011-11-14 LAB — POCT URINALYSIS DIPSTICK
Bilirubin, UA: NEGATIVE
Blood, UA: NEGATIVE
Glucose, UA: NEGATIVE
Ketones, UA: NEGATIVE
Leukocytes, UA: NEGATIVE
Nitrite, UA: NEGATIVE

## 2011-11-14 LAB — BASIC METABOLIC PANEL
CO2: 29 mEq/L (ref 19–32)
Chloride: 106 mEq/L (ref 96–112)
Glucose, Bld: 91 mg/dL (ref 70–99)
Sodium: 141 mEq/L (ref 135–145)

## 2011-11-14 LAB — HEPATIC FUNCTION PANEL
Albumin: 3.9 g/dL (ref 3.5–5.2)
Alkaline Phosphatase: 56 U/L (ref 39–117)
Total Protein: 7 g/dL (ref 6.0–8.3)

## 2011-11-14 LAB — TSH: TSH: 0.53 u[IU]/mL (ref 0.35–5.50)

## 2011-11-14 LAB — LIPID PANEL
Total CHOL/HDL Ratio: 3
Triglycerides: 74 mg/dL (ref 0.0–149.0)

## 2011-11-17 ENCOUNTER — Ambulatory Visit (INDEPENDENT_AMBULATORY_CARE_PROVIDER_SITE_OTHER): Payer: Medicare HMO | Admitting: Family Medicine

## 2011-11-17 ENCOUNTER — Encounter: Payer: Self-pay | Admitting: Family Medicine

## 2011-11-17 VITALS — BP 110/80 | Temp 98.2°F | Ht 62.75 in | Wt 132.0 lb

## 2011-11-17 DIAGNOSIS — Z0000 Encounter for general adult medical examination without abnormal findings: Secondary | ICD-10-CM

## 2011-11-17 DIAGNOSIS — R002 Palpitations: Secondary | ICD-10-CM | POA: Insufficient documentation

## 2011-11-17 NOTE — Patient Instructions (Addendum)
Return in one year sooner if any problems 

## 2011-11-17 NOTE — Progress Notes (Signed)
  Subjective:    Patient ID: Wendy Holden, female    DOB: Jul 19, 1940, 71 y.o.   MRN: [id number]  HPI Wendy Holden is a 71 year old married female nonsmoker who comes in today for a Medicare wellness examination  She's always been a HAL she's had no chronic health problems. She does have occasional palpitations for which she takes Inderal 10 mg twice a day  Currently she only takes an aspirin And calcium vitamin D.  [facility]  She gets routine eye care, dental care, BSE monthly, and you mammography, colonoscopy normal in GI, tetanus 2009, Pneumovax x2, declines to the shingles  Cognitive function normal she walks on a regular basis. Home health safety reviewed no issues identified, no guns in the house, she does have a health care power of attorney and a living well   Review of Systems  Constitutional: Negative.   HENT: Negative.   Eyes: Negative.   Respiratory: Negative.   Cardiovascular: Negative.   Gastrointestinal: Negative.   Genitourinary: Negative.   Musculoskeletal: Negative.   Neurological: Negative.   Hematological: Negative.   Psychiatric/Behavioral: Negative.        Objective:   Physical Exam  Constitutional: She appears well-developed and well-nourished.  HENT:  Head: Normocephalic and atraumatic.  Right Ear: External ear normal.  Left Ear: External ear normal.  Nose: Nose normal.  Mouth/Throat: Oropharynx is clear and moist.  Eyes: EOM are normal. Pupils are equal, round, and reactive to light.  Neck: Normal range of motion. Neck supple. No thyromegaly present.  Cardiovascular: Normal rate, regular rhythm, normal heart sounds and intact distal pulses.  Exam reveals no gallop and no friction rub.   No murmur heard. Pulmonary/Chest: Effort normal and breath sounds normal.  Abdominal: Soft. Bowel sounds are normal. She exhibits no distension and no mass. There is no tenderness. There is no rebound.  Genitourinary:       Bilateral  breast exam normal except for scar right breast 12:00 from previous biopsy. Pelvic and rectal last year normal therefore not repeated asymptomatic  Musculoskeletal: Normal range of motion.  Lymphadenopathy:    She has no cervical adenopathy.  Neurological: She is alert. She has normal reflexes. No cranial nerve deficit. She exhibits normal muscle tone. Coordination normal.  Skin: Skin is warm and dry.  Psychiatric: She has a normal mood and affect. Her behavior is normal. Judgment and thought content normal.          Assessment & Plan:  Healthy female  Occasional palpitations reassured

## 2012-02-06 ENCOUNTER — Encounter: Payer: Self-pay | Admitting: Family Medicine

## 2012-02-06 ENCOUNTER — Ambulatory Visit (INDEPENDENT_AMBULATORY_CARE_PROVIDER_SITE_OTHER): Payer: Medicare HMO | Admitting: Family Medicine

## 2012-02-06 VITALS — BP 120/80 | Temp 97.9°F | Wt 134.0 lb

## 2012-02-06 DIAGNOSIS — Z23 Encounter for immunization: Secondary | ICD-10-CM

## 2012-02-06 DIAGNOSIS — R300 Dysuria: Secondary | ICD-10-CM

## 2012-02-06 DIAGNOSIS — R350 Frequency of micturition: Secondary | ICD-10-CM

## 2012-02-06 LAB — POCT URINALYSIS DIPSTICK
Bilirubin, UA: NEGATIVE
Glucose, UA: NEGATIVE
Nitrite, UA: NEGATIVE
Spec Grav, UA: 1.025
Urobilinogen, UA: 0.2

## 2012-02-06 NOTE — Patient Instructions (Signed)
Consult with a urologist in [facility] where you currently live

## 2012-02-06 NOTE — Progress Notes (Signed)
  Subjective:    Patient ID: Wendy Holden, female    DOB: 04-24-1941, 71 y.o.   MRN: [id number]  HPI Wendy Holden is a 71 year old married female nonsmoker who comes in today for evaluation of frequent urination  She states over the past 6-8 weeks she's had symptoms of burning on urination with frequent urination sometimes nocturia x6. She's noticed some pelvic discomfort that comes and goes a sensation of bloating and sometimes she feels like she can't empty her bladder. She's had no fever chills nausea vomiting diarrhea change in bowel habits.   Review of Systems    general and urinary tract review of systems otherwise negative Objective:   Physical Exam Well-developed well-nourished female no acute distress examination the abdomen is negative pelvic examination external genitalia normal for age. Bimanual exam shows no palpable masses  Urinalysis normal         Assessment & Plan:  Urinary tract symptoms of frequency and inability to empty bladder and dysuria without infection plan urologic consult

## 2012-08-17 ENCOUNTER — Other Ambulatory Visit: Payer: Self-pay

## 2012-08-17 DIAGNOSIS — Z1231 Encounter for screening mammogram for malignant neoplasm of breast: Secondary | ICD-10-CM

## 2012-10-11 ENCOUNTER — Ambulatory Visit
Admission: RE | Admit: 2012-10-11 | Discharge: 2012-10-11 | Disposition: A | Discharge status: Home / Self Care | Payer: Medicare HMO | Source: Ambulatory Visit

## 2012-10-11 DIAGNOSIS — Z1231 Encounter for screening mammogram for malignant neoplasm of breast: Secondary | ICD-10-CM

## 2012-11-12 ENCOUNTER — Other Ambulatory Visit (INDEPENDENT_AMBULATORY_CARE_PROVIDER_SITE_OTHER): Payer: Medicare HMO

## 2012-11-12 DIAGNOSIS — Z0000 Encounter for general adult medical examination without abnormal findings: Secondary | ICD-10-CM

## 2012-11-12 DIAGNOSIS — R002 Palpitations: Secondary | ICD-10-CM

## 2012-11-12 DIAGNOSIS — Z136 Encounter for screening for cardiovascular disorders: Secondary | ICD-10-CM

## 2012-11-12 LAB — HEPATIC FUNCTION PANEL
Albumin: 3.8 g/dL (ref 3.5–5.2)
Alkaline Phosphatase: 51 U/L (ref 39–117)
Total Protein: 6.6 g/dL (ref 6.0–8.3)

## 2012-11-12 LAB — LIPID PANEL
HDL: 62.7 mg/dL (ref >39.00)
Total CHOL/HDL Ratio: 3
Triglycerides: 52 mg/dL (ref 0.0–149.0)
VLDL: 10.4 mg/dL (ref 0.0–40.0)

## 2012-11-12 LAB — POCT URINALYSIS DIPSTICK
Leukocytes, UA: NEGATIVE
Protein, UA: NEGATIVE
Spec Grav, UA: 1.025
Urobilinogen, UA: 0.2
pH, UA: 5.5

## 2012-11-12 LAB — CBC WITH DIFFERENTIAL/PLATELET
Basophils Relative: 1.4 % (ref 0.0–3.0)
Eosinophils Absolute: 0.2 10*3/uL (ref 0.0–0.7)
Hemoglobin: 13.5 g/dL (ref 12.0–15.0)
MCHC: 33.8 g/dL (ref 30.0–36.0)
MCV: 95.4 fl (ref 78.0–100.0)
Monocytes Absolute: 0.4 10*3/uL (ref 0.1–1.0)
Neutro Abs: 1.6 10*3/uL (ref 1.4–7.7)
RBC: 4.18 Mil/uL (ref 3.87–5.11)

## 2012-11-12 LAB — BASIC METABOLIC PANEL
CO2: 28 mEq/L (ref 19–32)
Chloride: 107 mEq/L (ref 96–112)
Sodium: 141 mEq/L (ref 135–145)

## 2012-11-14 ENCOUNTER — Other Ambulatory Visit: Payer: Medicare HMO

## 2012-11-19 ENCOUNTER — Encounter: Payer: Self-pay | Admitting: Family Medicine

## 2012-11-19 ENCOUNTER — Ambulatory Visit (INDEPENDENT_AMBULATORY_CARE_PROVIDER_SITE_OTHER): Payer: Medicare HMO | Admitting: Family Medicine

## 2012-11-19 ENCOUNTER — Other Ambulatory Visit (HOSPITAL_COMMUNITY)
Admission: RE | Admit: 2012-11-19 | Discharge: 2012-11-19 | Disposition: A | Discharge status: Home / Self Care | Payer: Medicare HMO | Source: Ambulatory Visit | Attending: Family Medicine | Admitting: Family Medicine

## 2012-11-19 VITALS — BP 120/84 | Temp 98.3°F | Ht 62.0 in | Wt 136.0 lb

## 2012-11-19 DIAGNOSIS — N63 Unspecified lump in unspecified breast: Secondary | ICD-10-CM

## 2012-11-19 DIAGNOSIS — Z0000 Encounter for general adult medical examination without abnormal findings: Secondary | ICD-10-CM

## 2012-11-19 DIAGNOSIS — Z01419 Encounter for gynecological examination (general) (routine) without abnormal findings: Secondary | ICD-10-CM

## 2012-11-19 DIAGNOSIS — M810 Age-related osteoporosis without current pathological fracture: Secondary | ICD-10-CM

## 2012-11-19 DIAGNOSIS — L500 Allergic urticaria: Secondary | ICD-10-CM

## 2012-11-19 DIAGNOSIS — Z8781 Personal history of (healed) traumatic fracture: Secondary | ICD-10-CM

## 2012-11-19 DIAGNOSIS — R002 Palpitations: Secondary | ICD-10-CM

## 2012-11-19 DIAGNOSIS — N6310 Unspecified lump in the right breast, unspecified quadrant: Secondary | ICD-10-CM | POA: Insufficient documentation

## 2012-11-19 DIAGNOSIS — Z124 Encounter for screening for malignant neoplasm of cervix: Secondary | ICD-10-CM | POA: Insufficient documentation

## 2012-11-19 NOTE — Patient Instructions (Signed)
Continue your weightbearing exercise calcium vitamin D and aspirin  We will set you up for followup bone density  Call your insurance company and find out the best price for shingles vaccine  Return in one year for general physical examination sooner if any problems  Remember to do BSE monthly and you next mammogram should be a 3-D because of the density of your breasts

## 2012-11-19 NOTE — Progress Notes (Signed)
  Subjective:    Patient ID: Wendy Holden, female    DOB: 04/15/1941, 72 y.o.   MRN: [id number]  HPI Wendy Holden is a 72 year old married female nonsmoker retired with her husband,,,,,,,, they lived in [place],,,,,, who comes in today for a Medicare wellness examination  He takes one aspirin tablet daily calcium vitamin D and exercises on a regular basis because of some osteo-porosis. Her last bone density was about 3 years ago therefore she is due for followup study.  She gets routine eye care, dental care, she does not do BSE monthly,,,,,, she showed me her a letter for mammogram which was normal except that she has dense breasts,,,,, recommend she get a 3-D and do BSE monthly,,,,,,,. Colonoscopy and GI normal, vaccinations up-to-date encouraged a shingles vaccine  Cognitive function normal she walks on a regular basis home health safety reviewed no issues identified, no guns in the house, she does have a health care power of attorney and living well  She says she feels well except she has some dull aching in the right side of her chest. This comes and goes and is a 1 on a scale of 1-10. It's been present for about 5 years. It's not related to eating walking bending taking a deep breath etc. Etc. It doesn't wake her up at night    Review of Systems  Constitutional: Negative.   HENT: Negative.   Eyes: Negative.   Respiratory: Negative.   Cardiovascular: Negative.   Gastrointestinal: Negative.   Genitourinary: Negative.   Musculoskeletal: Negative.   Neurological: Negative.   Psychiatric/Behavioral: Negative.        Objective:   Physical Exam  Constitutional: She appears well-developed and well-nourished.  HENT:  Head: Normocephalic and atraumatic.  Right Ear: External ear normal.  Left Ear: External ear normal.  Nose: Nose normal.  Mouth/Throat: Oropharynx is clear and moist.  Eyes: EOM are normal. Pupils are equal, round, and reactive to light.  Neck: Normal range of motion.  Neck supple. No thyromegaly present.  Cardiovascular: Normal rate, regular rhythm, normal heart sounds and intact distal pulses.  Exam reveals no gallop and no friction rub.   No murmur heard. No carotid nor aortic bruits peripheral pulses 2+ and symmetrical  Pulmonary/Chest: Effort normal and breath sounds normal.  Abdominal: Soft. Bowel sounds are normal. She exhibits no distension and no mass. There is no tenderness. There is no rebound.  Genitourinary: Vagina normal and uterus normal. Guaiac negative stool. No vaginal discharge found.  Bilateral breast exam normal she does have a scar right breast 12:00 position an inch above the nipple from previous benign biopsy  Musculoskeletal: Normal range of motion.  Lymphadenopathy:    She has no cervical adenopathy.  Neurological: She is alert. She has normal reflexes. No cranial nerve deficit. She exhibits normal muscle tone. Coordination normal.  Skin: Skin is warm and dry.  Total body skin exam normal  Psychiatric: She has a normal mood and affect. Her behavior is normal. Judgment and thought content normal.          Assessment & Plan:  Healthy female  History of osteoporosis on calcium vitamin D and weightbearing exercise daily,,,,,, followup at bone density  Chest wall pain reassured  History of right breast cyst biopsy benign encouraged to BSE monthly and 3-D mammogram on her next mammogram

## 2012-12-06 ENCOUNTER — Ambulatory Visit (INDEPENDENT_AMBULATORY_CARE_PROVIDER_SITE_OTHER)
Admission: RE | Admit: 2012-12-06 | Discharge: 2012-12-06 | Disposition: A | Discharge status: Home / Self Care | Payer: Medicare HMO | Source: Ambulatory Visit | Attending: Family Medicine | Admitting: Family Medicine

## 2012-12-06 ENCOUNTER — Other Ambulatory Visit: Payer: Self-pay | Admitting: Family Medicine

## 2012-12-06 DIAGNOSIS — Z8781 Personal history of (healed) traumatic fracture: Secondary | ICD-10-CM

## 2012-12-18 ENCOUNTER — Telehealth: Payer: Self-pay | Admitting: Family Medicine

## 2012-12-18 NOTE — Telephone Encounter (Signed)
Tried cell number unable to get through.  Left message on machine at home number returning patient's call

## 2012-12-18 NOTE — Telephone Encounter (Signed)
Spoke to patient and copies of BMD test sent to home address

## 2012-12-18 NOTE — Telephone Encounter (Signed)
Requesting a call back from [facility] regarding bone density results.  Pt has additional questions she needs answered.

## 2012-12-18 NOTE — Telephone Encounter (Signed)
PT called to speak with you. She stated to try her mobile number again, and if not call her house. Please assist.

## 2012-12-18 NOTE — Telephone Encounter (Signed)
Tried to call patient but unable to reach at phone number given.  Will call at a later time.

## 2015-01-07 ENCOUNTER — Encounter: Payer: Self-pay | Admitting: Internal Medicine

## 2015-03-17 ENCOUNTER — Encounter: Payer: Self-pay | Admitting: Family Medicine

## 2015-03-17 ENCOUNTER — Ambulatory Visit (INDEPENDENT_AMBULATORY_CARE_PROVIDER_SITE_OTHER): Payer: Medicare PPO | Admitting: Family Medicine

## 2015-03-17 VITALS — BP 110/80 | Temp 97.8°F | Ht 61.5 in | Wt 132.0 lb

## 2015-03-17 DIAGNOSIS — Z0000 Encounter for general adult medical examination without abnormal findings: Secondary | ICD-10-CM | POA: Diagnosis not present

## 2015-03-17 DIAGNOSIS — M810 Age-related osteoporosis without current pathological fracture: Secondary | ICD-10-CM

## 2015-03-17 DIAGNOSIS — Z23 Encounter for immunization: Secondary | ICD-10-CM | POA: Diagnosis not present

## 2015-03-17 LAB — CBC WITH DIFFERENTIAL/PLATELET
BASOS PCT: 0.8 % (ref 0.0–3.0)
Basophils Absolute: 0 10*3/uL (ref 0.0–0.1)
EOS ABS: 0.1 10*3/uL (ref 0.0–0.7)
EOS PCT: 4.4 % (ref 0.0–5.0)
HEMATOCRIT: 43.2 % (ref 36.0–46.0)
HEMOGLOBIN: 14.6 g/dL (ref 12.0–15.0)
LYMPHS PCT: 31.9 % (ref 12.0–46.0)
Lymphs Abs: 1.1 10*3/uL (ref 0.7–4.0)
MCHC: 33.8 g/dL (ref 30.0–36.0)
MCV: 93.2 fl (ref 78.0–100.0)
MONO ABS: 0.4 10*3/uL (ref 0.1–1.0)
Monocytes Relative: 12.4 % — ABNORMAL HIGH (ref 3.0–12.0)
Neutro Abs: 1.7 10*3/uL (ref 1.4–7.7)
Neutrophils Relative %: 50.5 % (ref 43.0–77.0)
Platelets: 203 10*3/uL (ref 150.0–400.0)
RBC: 4.63 Mil/uL (ref 3.87–5.11)
RDW: 13 % (ref 11.5–15.5)
WBC: 3.4 10*3/uL — AB (ref 4.0–10.5)

## 2015-03-17 LAB — POCT URINALYSIS DIPSTICK
Bilirubin, UA: NEGATIVE
GLUCOSE UA: NEGATIVE
Ketones, UA: NEGATIVE
Nitrite, UA: NEGATIVE
PROTEIN UA: NEGATIVE
SPEC GRAV UA: 1.025
UROBILINOGEN UA: 0.2
pH, UA: 5

## 2015-03-17 MED ORDER — ALENDRONATE SODIUM 70 MG PO TABS: 70.0000 mg | ORAL_TABLET | ORAL | Status: DC

## 2015-03-17 NOTE — Patient Instructions (Signed)
Continue daily exercise  Calcium vitamin D........ a combination pill works well daily  Fosamax 70 mg........ once weekly.......... I would take this for a total of 3 years and then stop  Continue good health habits  Continue sunscreens daily  Return in one year sooner if any problem  [employer]..........Marland Kitchen our new adult nurse practitioner from [facility]

## 2015-03-17 NOTE — Progress Notes (Signed)
Subjective:    Patient ID: Wendy Holden, female    DOB: 11-11-1940, 74 y.o.   MRN: [id number]  HPI Wendy Holden is a 74 year old married female nonsmoker who comes in today for general physical examination because of a history of ostial porosis  Her weight is 132 pounds. She 61.5 inches tall. BMI is 24.9. She exercises on a daily basis. She has had a history of low bone density in the past. We had her on Fosamax for 3 years about 5 years ago. She took it for 4 years total was off for 2 years and then it was restarted back a year ago at [facility] where she moved for retirement. She's also taking calcium vitamin D. Bone density showed a spine of -2.6. The other bone density is measured showed osteopenia.  She gets routine eye care, dental care, BSE monthly, annual mammography, colonoscopy 2008 normal  Vaccinations history she will be given Pneumovax 13 and flu shot today. Also advised to strongly consider the shingles vaccine  She's a G2 P2 went through menopause around age 45 [employer] wife. No family history of any GYN malignancies. Asymptomatic therefore Pap done every 3 years. Last Pap was 2014 which was normal  She brings lab work from [facility] that was done in July of this year. Metabolic panel thyroid liver tests all within normal limits   Review of Systems  Constitutional: Negative.   HENT: Negative.   Eyes: Negative.   Respiratory: Negative.   Cardiovascular: Negative.   Gastrointestinal: Negative.   Endocrine: Negative.   Genitourinary: Negative.   Musculoskeletal: Negative.   Skin: Negative.   Allergic/Immunologic: Negative.   Neurological: Negative.   Hematological: Negative.   Psychiatric/Behavioral: Negative.        Objective:   Physical Exam  Constitutional: She appears well-developed and well-nourished.  HENT:  Head: Normocephalic and atraumatic.  Right Ear: External ear normal.  Left Ear: External ear normal.  Nose: Nose normal.  Mouth/Throat:  Oropharynx is clear and moist.  Eyes: EOM are normal. Pupils are equal, round, and reactive to light.  Neck: Normal range of motion. Neck supple. No JVD present. No tracheal deviation present. No thyromegaly present.  Cardiovascular: Normal rate, regular rhythm, normal heart sounds and intact distal pulses.  Exam reveals no gallop and no friction rub.   No murmur heard. No carotid aortic bruits peripheral pulses 2+ and symmetrical  Pulmonary/Chest: Effort normal and breath sounds normal. No stridor. No respiratory distress. She has no wheezes. She has no rales. She exhibits no tenderness.  Abdominal: Soft. Bowel sounds are normal. She exhibits no distension and no mass. There is no tenderness. There is no rebound and no guarding.  Genitourinary:  Bilateral breast exam normal except for scar right breast 12:00 position from previous excisional biopsy  Musculoskeletal: Normal range of motion.  Lymphadenopathy:    She has no cervical adenopathy.  Neurological: She is alert. She has normal reflexes. No cranial nerve deficit. She exhibits normal muscle tone. Coordination normal.  Skin: Skin is warm and dry. No rash noted. No erythema. No pallor.  Total body skin exam normal  Psychiatric: She has a normal mood and affect. Her behavior is normal. Judgment and thought content normal.  Nursing note and vitals reviewed.         Assessment & Plan:  Healthy female  Osteoporosis........Marland Kitchen bone density done 2016 showed a spinal measurement of -2.6. The other measurements were in the osteopenia category.............Marland Kitchen recommend she take Fosamax for 3 years  calcium vitamin D and continue her exercise program.

## 2015-03-17 NOTE — Progress Notes (Signed)
Pre visit review using our clinic review tool, if applicable. No additional management support is needed unless otherwise documented below in the visit note. 

## 2016-05-03 ENCOUNTER — Encounter: Payer: Self-pay | Admitting: Family Medicine

## 2016-05-03 ENCOUNTER — Ambulatory Visit (INDEPENDENT_AMBULATORY_CARE_PROVIDER_SITE_OTHER): Payer: Medicare PPO | Admitting: Family Medicine

## 2016-05-03 VITALS — BP 128/72 | HR 70 | Temp 98.2°F | Ht 61.75 in | Wt 134.9 lb

## 2016-05-03 DIAGNOSIS — M810 Age-related osteoporosis without current pathological fracture: Secondary | ICD-10-CM | POA: Diagnosis not present

## 2016-05-03 DIAGNOSIS — Z23 Encounter for immunization: Secondary | ICD-10-CM | POA: Diagnosis not present

## 2016-05-03 DIAGNOSIS — Z0000 Encounter for general adult medical examination without abnormal findings: Secondary | ICD-10-CM

## 2016-05-03 LAB — POCT URINALYSIS DIPSTICK
Bilirubin, UA: NEGATIVE
Blood, UA: NEGATIVE
GLUCOSE UA: NEGATIVE
Ketones, UA: NEGATIVE
Nitrite, UA: NEGATIVE
Protein, UA: NEGATIVE
SPEC GRAV UA: 1.015
UROBILINOGEN UA: 0.2
pH, UA: 5.5

## 2016-05-03 MED ORDER — ALENDRONATE SODIUM 70 MG PO TABS: 70.0000 mg | ORAL_TABLET | ORAL | 6 refills | Status: AC

## 2016-05-03 MED ORDER — ESTROGENS, CONJUGATED 0.625 MG/GM VA CREA: 1.0000 | TOPICAL_CREAM | Freq: Every day | VAGINAL | 24 refills | Status: AC

## 2016-05-03 NOTE — Progress Notes (Signed)
Pre visit review using our clinic review tool, if applicable. No additional management support is needed unless otherwise documented below in the visit note. 

## 2016-05-03 NOTE — Progress Notes (Signed)
Wendy Holden is a 75 year old married female nonsmoker who comes in today for general evaluation because a history of osteoporosis. This is been a long-standing problem for her. She's been on Fosamax 70 mg weekly for many years. Couple years ago we took her off because of the data showing you should take a break every 3 years. She had a recent bone density it [facility] which showed her bone density to be a -2.5. Calcium vitamin D and exercise daily. This bone density was done June 2017  She had lab work in August 2017 which was normal. They did not do a urinalysis.  She gets routine eye care, dental care, BSE monthly, annual mammography, colonoscopy 2008 was normal.  Vaccinations up-to-date  Cognitive function normal she walks daily home health safety reviewed no issues identified, no guns in the house, she does have a healthcare power of attorney and living well. Blood pressures of always been normal BP 120/72 today. Blood sugar averages 102 which is not clinically significant.  She'll contact her insurance company to find out where she can get the shingles vaccine.  She does have dry skin and extremely dry vagina. Her last Pap was in 2014 at age 64 with no risk factors she does not need a repeat Pap smear. She's had no bloating abdominal bloating spotting etc. etc.  14 point review of systems negative except for above  Physical evaluationBP 128/72 (BP Location: Left Arm, Patient Position: Sitting, Cuff Size: Normal)   Pulse 70   Temp 98.2 F (36.8 C) (Oral)   Ht 5' 1.75" (1.568 m)   Wt 134 lb 14.4 oz (61.2 kg)   SpO2 98%   BMI 24.87 kg/m  Examination of the HEENT were negative neck was supple no adenopathy thyroid normal no carotid bruits cardiopulmonary exam normal breast exam normal abdominal exam normal pelvic and rectal not indicated extremities normal skin normal peripheral pulses normal  #1 osteoporosis continue calcium vitamin D and Fosamax  Vaginal dryness refill Premarin vaginal  cream.

## 2016-05-03 NOTE — Patient Instructions (Addendum)
Continue daily walking calcium and vitamin D supplements. Continue the Fosamax 1 tablet weekly  Follow-up in one year sooner if any problem  The Premarin vaginal cream is [email or web address]

## 2017-01-19 LAB — HM MAMMOGRAPHY

## 2017-03-06 ENCOUNTER — Encounter: Payer: Self-pay | Admitting: Internal Medicine

## 2017-03-30 DIAGNOSIS — M5441 Lumbago with sciatica, right side: Secondary | ICD-10-CM | POA: Diagnosis not present

## 2017-03-30 DIAGNOSIS — M545 Low back pain: Secondary | ICD-10-CM | POA: Diagnosis not present

## 2017-04-03 DIAGNOSIS — M5441 Lumbago with sciatica, right side: Secondary | ICD-10-CM | POA: Diagnosis not present

## 2017-04-03 DIAGNOSIS — M545 Low back pain: Secondary | ICD-10-CM | POA: Diagnosis not present

## 2017-04-05 DIAGNOSIS — M5441 Lumbago with sciatica, right side: Secondary | ICD-10-CM | POA: Diagnosis not present

## 2017-04-05 DIAGNOSIS — M545 Low back pain: Secondary | ICD-10-CM | POA: Diagnosis not present

## 2017-04-12 DIAGNOSIS — M5441 Lumbago with sciatica, right side: Secondary | ICD-10-CM | POA: Diagnosis not present

## 2017-04-12 DIAGNOSIS — M545 Low back pain: Secondary | ICD-10-CM | POA: Diagnosis not present

## 2017-05-09 ENCOUNTER — Encounter: Payer: Medicare PPO | Admitting: Family Medicine

## 2017-06-06 ENCOUNTER — Ambulatory Visit (INDEPENDENT_AMBULATORY_CARE_PROVIDER_SITE_OTHER): Payer: Medicare PPO | Admitting: Family Medicine

## 2017-06-06 ENCOUNTER — Encounter: Payer: Self-pay | Admitting: Family Medicine

## 2017-06-06 VITALS — BP 110/82 | HR 58 | Temp 98.3°F | Ht 61.0 in | Wt 130.0 lb

## 2017-06-06 DIAGNOSIS — M810 Age-related osteoporosis without current pathological fracture: Secondary | ICD-10-CM | POA: Diagnosis not present

## 2017-06-06 DIAGNOSIS — Z23 Encounter for immunization: Secondary | ICD-10-CM | POA: Diagnosis not present

## 2017-06-06 DIAGNOSIS — Z0000 Encounter for general adult medical examination without abnormal findings: Secondary | ICD-10-CM

## 2017-06-06 LAB — POCT URINALYSIS DIPSTICK
Bilirubin, UA: NEGATIVE
Blood, UA: NEGATIVE
GLUCOSE UA: NEGATIVE
KETONES UA: NEGATIVE
LEUKOCYTES UA: NEGATIVE
Nitrite, UA: NEGATIVE
ODOR: NEGATIVE
Protein, UA: NEGATIVE
SPEC GRAV UA: 1.025 (ref 1.010–1.025)
Urobilinogen, UA: 0.2 E.U./dL
pH, UA: 6 (ref 5.0–8.0)

## 2017-06-06 NOTE — Patient Instructions (Signed)
Walk 30 minutes daily  Motrin 4 mg twice daily with food  Consider an audiogram  Stop the Fosamax. Follow-up bone density in 2 years  Return in one year for general physical exam sooner if any problems  Call your insurance company and find out where you can get the new shingles vaccine

## 2017-06-06 NOTE — Progress Notes (Signed)
Wendy Holden is a 77 year old married female nonsmoker who comes in today for general physical examination  [place] allow other health maintenance things done there.  She gets routine eye care, dental care, BSE monthly, and you mammography, colonoscopy at [facility] was normal. BMD was also normal.  Current medications Fosamax 70 mg daily she's been on that now for 3 years. Advised her to stop.  She had an episode of severe right hip pain last fall. Was triggered by a six-hour riding in a car. She subsequently had physical therapy for 2 weeks and the pain went away except now she has some stiffness in that hip joint. Also complains of stiffness in her left hip. Her weight is 130 pounds and she walks daily  Cognitive function normal she walks daily home health safety reviewed no issues identified, no guns in the house, she does have a healthcare power of attorney and living well.  14 point review of systems June otherwise negative  Vaccinations history seasonal flu shot given today information given on the new shingles vaccine. Tetanus booster due July 2019  She has been for 50 years. Uterus and ovaries are still intact or previous pelvics and Paps are normal. GYN wise she is asymptomatic. She quit using a hormonal cream.  BP 110/82 (BP Location: Left Arm, Patient Position: Sitting, Cuff Size: Normal)   Pulse (!) 58   Temp 98.3 F (36.8 C) (Oral)   Ht 5\' 1"  (1.549 m)   Wt 130 lb (59 kg)   BMI 24.56 kg/m  Well-developed well-nourished thin female no acute distress examination HEENT was pertinent she has early bilateral cataracts neck was supple thyroid is not enlarged no carotid bruits cardiopulmonary exam normal breast exam normal except for scar right breast 12:00 from previous biopsy. Abdominal exam was negative except for scar lower midline where she had a previous BTL. She is a para 2. Pelvic and  rectal not indicated extremities normal skin normal peripheral pulses normal except for marked limitation of her right and left hip. Right hip I can actually rotate about 15 left hip about 45.  #1 healthy female  #2 history of osteoporosis now on Fosamax greater than 3 years with normal bone density........Marland Kitchen recommend she stop the Fosamax  #3 osteoarthritis right and left hips........Marland Kitchen Motrin 400 twice a day daily exercise  Number for early bilateral cataracts asymptomatic..... Follow-up eye exam  #5 hearing loss........Marland Kitchen recommend audiogram

## 2017-06-07 LAB — BASIC METABOLIC PANEL
BUN: 16 mg/dL (ref 6–23)
CALCIUM: 9.4 mg/dL (ref 8.4–10.5)
CO2: 31 mEq/L (ref 19–32)
Chloride: 101 mEq/L (ref 96–112)
Creatinine, Ser: 0.77 mg/dL (ref 0.40–1.20)
GFR: 77.14 mL/min (ref >60.00)
GLUCOSE: 104 mg/dL — AB (ref 70–99)
POTASSIUM: 4.1 meq/L (ref 3.5–5.1)
SODIUM: 140 meq/L (ref 135–145)

## 2017-06-07 LAB — CBC WITH DIFFERENTIAL/PLATELET
BASOS ABS: 0 10*3/uL (ref 0.0–0.1)
BASOS PCT: 1.1 % (ref 0.0–3.0)
EOS ABS: 0.2 10*3/uL (ref 0.0–0.7)
Eosinophils Relative: 5.5 % — ABNORMAL HIGH (ref 0.0–5.0)
HEMATOCRIT: 40.6 % (ref 36.0–46.0)
Hemoglobin: 13.4 g/dL (ref 12.0–15.0)
LYMPHS ABS: 1.4 10*3/uL (ref 0.7–4.0)
LYMPHS PCT: 31 % (ref 12.0–46.0)
MCHC: 33.1 g/dL (ref 30.0–36.0)
MCV: 95.8 fl (ref 78.0–100.0)
MONO ABS: 0.5 10*3/uL (ref 0.1–1.0)
Monocytes Relative: 10.6 % (ref 3.0–12.0)
NEUTROS ABS: 2.3 10*3/uL (ref 1.4–7.7)
NEUTROS PCT: 51.8 % (ref 43.0–77.0)
PLATELETS: 196 10*3/uL (ref 150.0–400.0)
RBC: 4.23 Mil/uL (ref 3.87–5.11)
RDW: 12.8 % (ref 11.5–15.5)
WBC: 4.4 10*3/uL (ref 4.0–10.5)

## 2017-06-07 LAB — VITAMIN D 25 HYDROXY (VIT D DEFICIENCY, FRACTURES): VITD: 50.07 ng/mL (ref 30.00–100.00)

## 2017-06-07 LAB — TSH: TSH: 1.25 u[IU]/mL (ref 0.35–4.50)

## 2017-06-23 ENCOUNTER — Telehealth: Payer: Self-pay | Admitting: *Deleted

## 2017-06-23 NOTE — Telephone Encounter (Signed)
Copied from [place] [id number]. Topic: Quick Communication - Lab Results >> Jun 23, 2017  3:55 PM Synthia Innocent wrote: Requesting lab results from 06/06/17

## 2017-06-26 NOTE — Telephone Encounter (Signed)
Pt is requesting her Lab results for 06/06/2017, Please Advise

## 2017-06-26 NOTE — Telephone Encounter (Signed)
Spoke with pt voiced understanding that lab work was normal.

## 2017-06-26 NOTE — Telephone Encounter (Signed)
Wendy Holden please call her labs were all normal

## 2017-09-28 DIAGNOSIS — Z7982 Long term (current) use of aspirin: Secondary | ICD-10-CM | POA: Diagnosis not present

## 2017-09-28 DIAGNOSIS — Z6823 Body mass index (BMI) 23.0-23.9, adult: Secondary | ICD-10-CM | POA: Diagnosis not present

## 2017-09-28 DIAGNOSIS — H353120 Nonexudative age-related macular degeneration, left eye, stage unspecified: Secondary | ICD-10-CM | POA: Diagnosis not present

## 2017-09-28 DIAGNOSIS — Z808 Family history of malignant neoplasm of other organs or systems: Secondary | ICD-10-CM | POA: Diagnosis not present

## 2017-09-28 DIAGNOSIS — Z8052 Family history of malignant neoplasm of bladder: Secondary | ICD-10-CM | POA: Diagnosis not present

## 2018-01-04 DIAGNOSIS — E785 Hyperlipidemia, unspecified: Secondary | ICD-10-CM | POA: Diagnosis not present

## 2018-01-04 DIAGNOSIS — M810 Age-related osteoporosis without current pathological fracture: Secondary | ICD-10-CM | POA: Diagnosis not present

## 2018-01-11 DIAGNOSIS — M810 Age-related osteoporosis without current pathological fracture: Secondary | ICD-10-CM | POA: Diagnosis not present

## 2018-01-11 DIAGNOSIS — Z1389 Encounter for screening for other disorder: Secondary | ICD-10-CM | POA: Diagnosis not present

## 2018-01-11 DIAGNOSIS — Z0000 Encounter for general adult medical examination without abnormal findings: Secondary | ICD-10-CM | POA: Diagnosis not present

## 2018-01-11 DIAGNOSIS — E785 Hyperlipidemia, unspecified: Secondary | ICD-10-CM | POA: Diagnosis not present

## 2018-03-16 DIAGNOSIS — Z1231 Encounter for screening mammogram for malignant neoplasm of breast: Secondary | ICD-10-CM | POA: Diagnosis not present

## 2018-03-19 DIAGNOSIS — L729 Follicular cyst of the skin and subcutaneous tissue, unspecified: Secondary | ICD-10-CM | POA: Diagnosis not present

## 2018-03-19 DIAGNOSIS — R0982 Postnasal drip: Secondary | ICD-10-CM | POA: Diagnosis not present

## 2018-03-19 DIAGNOSIS — J342 Deviated nasal septum: Secondary | ICD-10-CM | POA: Diagnosis not present

## 2018-05-03 DIAGNOSIS — R000 Tachycardia, unspecified: Secondary | ICD-10-CM | POA: Diagnosis not present

## 2018-05-03 DIAGNOSIS — R0600 Dyspnea, unspecified: Secondary | ICD-10-CM | POA: Diagnosis not present

## 2018-05-03 DIAGNOSIS — R600 Localized edema: Secondary | ICD-10-CM | POA: Diagnosis not present

## 2018-05-15 DIAGNOSIS — Z7901 Long term (current) use of anticoagulants: Secondary | ICD-10-CM | POA: Diagnosis not present

## 2018-05-15 DIAGNOSIS — R001 Bradycardia, unspecified: Secondary | ICD-10-CM | POA: Diagnosis not present

## 2018-05-15 DIAGNOSIS — I5023 Acute on chronic systolic (congestive) heart failure: Secondary | ICD-10-CM | POA: Diagnosis not present

## 2018-05-15 DIAGNOSIS — I4892 Unspecified atrial flutter: Secondary | ICD-10-CM | POA: Diagnosis not present

## 2018-05-15 DIAGNOSIS — R0602 Shortness of breath: Secondary | ICD-10-CM | POA: Diagnosis not present

## 2018-05-15 DIAGNOSIS — I340 Nonrheumatic mitral (valve) insufficiency: Secondary | ICD-10-CM | POA: Diagnosis not present

## 2018-05-15 DIAGNOSIS — J9601 Acute respiratory failure with hypoxia: Secondary | ICD-10-CM | POA: Diagnosis not present

## 2018-05-15 DIAGNOSIS — Z9851 Tubal ligation status: Secondary | ICD-10-CM | POA: Diagnosis not present

## 2018-05-15 DIAGNOSIS — R570 Cardiogenic shock: Secondary | ICD-10-CM | POA: Diagnosis not present

## 2018-05-15 DIAGNOSIS — R000 Tachycardia, unspecified: Secondary | ICD-10-CM | POA: Diagnosis not present

## 2018-05-15 DIAGNOSIS — J984 Other disorders of lung: Secondary | ICD-10-CM | POA: Diagnosis not present

## 2018-05-15 DIAGNOSIS — I4891 Unspecified atrial fibrillation: Secondary | ICD-10-CM | POA: Diagnosis not present

## 2018-05-15 DIAGNOSIS — I5021 Acute systolic (congestive) heart failure: Secondary | ICD-10-CM | POA: Diagnosis not present

## 2018-05-15 DIAGNOSIS — E872 Acidosis: Secondary | ICD-10-CM | POA: Diagnosis not present

## 2018-05-15 DIAGNOSIS — I483 Typical atrial flutter: Secondary | ICD-10-CM | POA: Diagnosis not present

## 2018-05-28 DIAGNOSIS — I4891 Unspecified atrial fibrillation: Secondary | ICD-10-CM | POA: Diagnosis not present

## 2018-05-28 DIAGNOSIS — Z6826 Body mass index (BMI) 26.0-26.9, adult: Secondary | ICD-10-CM | POA: Diagnosis not present

## 2018-05-28 DIAGNOSIS — I509 Heart failure, unspecified: Secondary | ICD-10-CM | POA: Diagnosis not present

## 2018-05-31 DIAGNOSIS — I483 Typical atrial flutter: Secondary | ICD-10-CM | POA: Diagnosis not present

## 2018-05-31 DIAGNOSIS — I5022 Chronic systolic (congestive) heart failure: Secondary | ICD-10-CM | POA: Diagnosis not present

## 2018-05-31 DIAGNOSIS — I428 Other cardiomyopathies: Secondary | ICD-10-CM | POA: Diagnosis not present

## 2018-06-07 DIAGNOSIS — R946 Abnormal results of thyroid function studies: Secondary | ICD-10-CM | POA: Diagnosis not present

## 2023-09-28 DEATH — deceased
